# Patient Record
Sex: Female | Born: 1945 | Race: White | Hispanic: No | Marital: Married | Smoking: Former smoker
Health system: Southern US, Community
[De-identification: ages and names within clinical notes are randomized; demographics above are authoritative.]

## PROBLEM LIST (undated history)

## (undated) DIAGNOSIS — I10 Essential (primary) hypertension: Secondary | ICD-10-CM

## (undated) DIAGNOSIS — M8580 Other specified disorders of bone density and structure, unspecified site: Secondary | ICD-10-CM

## (undated) DIAGNOSIS — F419 Anxiety disorder, unspecified: Secondary | ICD-10-CM

## (undated) DIAGNOSIS — E785 Hyperlipidemia, unspecified: Secondary | ICD-10-CM

## (undated) DIAGNOSIS — E782 Mixed hyperlipidemia: Secondary | ICD-10-CM

## (undated) DIAGNOSIS — E569 Vitamin deficiency, unspecified: Secondary | ICD-10-CM

## (undated) DIAGNOSIS — G4700 Insomnia, unspecified: Secondary | ICD-10-CM

## (undated) DIAGNOSIS — Z8619 Personal history of other infectious and parasitic diseases: Secondary | ICD-10-CM

## (undated) DIAGNOSIS — N951 Menopausal and female climacteric states: Secondary | ICD-10-CM

## (undated) HISTORY — PX: KNEE SURGERY: SHX244

## (undated) HISTORY — DX: Other specified disorders of bone density and structure, unspecified site: M85.80

## (undated) HISTORY — DX: Mixed hyperlipidemia: E78.2

## (undated) HISTORY — DX: Essential (primary) hypertension: I10

## (undated) HISTORY — DX: Vitamin deficiency, unspecified: E56.9

## (undated) HISTORY — DX: Anxiety disorder, unspecified: F41.9

## (undated) HISTORY — PX: ABDOMINAL EXPLORATION SURGERY: SHX538

## (undated) HISTORY — DX: Menopausal and female climacteric states: N95.1

## (undated) HISTORY — PX: BREAST CYST EXCISION: SHX579

## (undated) HISTORY — PX: KNEE ARTHROSCOPY W/ ACL RECONSTRUCTION: SHX1858

## (undated) HISTORY — DX: Personal history of other infectious and parasitic diseases: Z86.19

## (undated) HISTORY — DX: Hyperlipidemia, unspecified: E78.5

## (undated) HISTORY — DX: Insomnia, unspecified: G47.00

---

## 1998-07-25 DIAGNOSIS — N951 Menopausal and female climacteric states: Secondary | ICD-10-CM

## 1998-07-25 HISTORY — DX: Menopausal and female climacteric states: N95.1

## 1999-07-01 ENCOUNTER — Other Ambulatory Visit: Admission: RE | Admit: 1999-07-01 | Discharge: 1999-07-01 | Payer: Self-pay | Admitting: Obstetrics and Gynecology

## 1999-08-12 ENCOUNTER — Encounter: Admission: RE | Admit: 1999-08-12 | Discharge: 1999-08-12 | Payer: Self-pay | Admitting: Family Medicine

## 1999-08-12 ENCOUNTER — Encounter: Payer: Self-pay | Admitting: Family Medicine

## 2000-08-15 ENCOUNTER — Other Ambulatory Visit: Admission: RE | Admit: 2000-08-15 | Discharge: 2000-08-15 | Payer: Self-pay | Admitting: Obstetrics and Gynecology

## 2001-09-27 ENCOUNTER — Other Ambulatory Visit: Admission: RE | Admit: 2001-09-27 | Discharge: 2001-09-27 | Payer: Self-pay | Admitting: Obstetrics and Gynecology

## 2001-10-05 ENCOUNTER — Encounter: Admission: RE | Admit: 2001-10-05 | Discharge: 2001-10-05 | Payer: Self-pay | Admitting: Obstetrics and Gynecology

## 2001-10-05 ENCOUNTER — Encounter: Payer: Self-pay | Admitting: Obstetrics and Gynecology

## 2003-01-02 ENCOUNTER — Other Ambulatory Visit: Admission: RE | Admit: 2003-01-02 | Discharge: 2003-01-02 | Payer: Self-pay | Admitting: Obstetrics and Gynecology

## 2003-04-10 ENCOUNTER — Ambulatory Visit (HOSPITAL_BASED_OUTPATIENT_CLINIC_OR_DEPARTMENT_OTHER): Admission: RE | Admit: 2003-04-10 | Discharge: 2003-04-10 | Payer: Self-pay | Admitting: Orthopedic Surgery

## 2003-10-15 ENCOUNTER — Encounter: Admission: RE | Admit: 2003-10-15 | Discharge: 2003-10-15 | Payer: Self-pay | Admitting: Obstetrics and Gynecology

## 2004-03-30 ENCOUNTER — Other Ambulatory Visit: Admission: RE | Admit: 2004-03-30 | Discharge: 2004-03-30 | Payer: Self-pay | Admitting: Obstetrics and Gynecology

## 2004-09-08 ENCOUNTER — Ambulatory Visit (HOSPITAL_COMMUNITY): Admission: RE | Admit: 2004-09-08 | Discharge: 2004-09-08 | Payer: Self-pay | Admitting: Gastroenterology

## 2005-09-19 ENCOUNTER — Other Ambulatory Visit: Admission: RE | Admit: 2005-09-19 | Discharge: 2005-09-19 | Payer: Self-pay | Admitting: Obstetrics & Gynecology

## 2005-11-07 ENCOUNTER — Encounter: Admission: RE | Admit: 2005-11-07 | Discharge: 2005-11-07 | Payer: Self-pay | Admitting: Obstetrics and Gynecology

## 2006-01-31 ENCOUNTER — Encounter: Admission: RE | Admit: 2006-01-31 | Discharge: 2006-01-31 | Payer: Self-pay | Admitting: Family Medicine

## 2006-02-17 ENCOUNTER — Encounter: Admission: RE | Admit: 2006-02-17 | Discharge: 2006-02-17 | Payer: Self-pay | Admitting: Family Medicine

## 2006-10-05 ENCOUNTER — Other Ambulatory Visit: Admission: RE | Admit: 2006-10-05 | Discharge: 2006-10-05 | Payer: Self-pay | Admitting: Obstetrics & Gynecology

## 2006-11-09 ENCOUNTER — Encounter: Admission: RE | Admit: 2006-11-09 | Discharge: 2006-11-09 | Payer: Self-pay | Admitting: Obstetrics and Gynecology

## 2007-03-08 ENCOUNTER — Encounter: Admission: RE | Admit: 2007-03-08 | Discharge: 2007-03-08 | Payer: Self-pay | Admitting: Family Medicine

## 2007-10-11 ENCOUNTER — Other Ambulatory Visit: Admission: RE | Admit: 2007-10-11 | Discharge: 2007-10-11 | Payer: Self-pay | Admitting: Obstetrics & Gynecology

## 2007-11-13 ENCOUNTER — Encounter: Admission: RE | Admit: 2007-11-13 | Discharge: 2007-11-13 | Payer: Self-pay | Admitting: Obstetrics and Gynecology

## 2008-01-17 ENCOUNTER — Encounter: Admission: RE | Admit: 2008-01-17 | Discharge: 2008-01-17 | Payer: Self-pay | Admitting: Family Medicine

## 2008-10-13 ENCOUNTER — Other Ambulatory Visit: Admission: RE | Admit: 2008-10-13 | Discharge: 2008-10-13 | Payer: Self-pay | Admitting: Obstetrics & Gynecology

## 2008-11-18 ENCOUNTER — Encounter: Admission: RE | Admit: 2008-11-18 | Discharge: 2008-11-18 | Payer: Self-pay | Admitting: Obstetrics and Gynecology

## 2009-11-19 ENCOUNTER — Encounter: Admission: RE | Admit: 2009-11-19 | Discharge: 2009-11-19 | Payer: Self-pay | Admitting: Obstetrics and Gynecology

## 2010-08-15 ENCOUNTER — Encounter: Payer: Self-pay | Admitting: Family Medicine

## 2010-10-22 ENCOUNTER — Other Ambulatory Visit: Payer: Self-pay | Admitting: Obstetrics and Gynecology

## 2010-10-22 DIAGNOSIS — N951 Menopausal and female climacteric states: Secondary | ICD-10-CM

## 2010-10-22 DIAGNOSIS — Z1231 Encounter for screening mammogram for malignant neoplasm of breast: Secondary | ICD-10-CM

## 2010-11-25 ENCOUNTER — Ambulatory Visit
Admission: RE | Admit: 2010-11-25 | Discharge: 2010-11-25 | Disposition: A | Discharge status: Home / Self Care | Payer: 59 | Source: Ambulatory Visit | Attending: Obstetrics and Gynecology | Admitting: Obstetrics and Gynecology

## 2010-11-25 DIAGNOSIS — Z1231 Encounter for screening mammogram for malignant neoplasm of breast: Secondary | ICD-10-CM

## 2010-11-25 DIAGNOSIS — N951 Menopausal and female climacteric states: Secondary | ICD-10-CM

## 2010-12-10 NOTE — Op Note (Signed)
Evelyn Heath, Evelyn Heath               ACCOUNT NO.:  [id number]   MEDICAL RECORD NO.:  [id number]          PATIENT TYPE:  AMB   LOCATION:  ENDO                         FACILITY:  [facility]   PHYSICIAN:  Anselmo Rod, M.D.  DATE OF BIRTH:  Oct 03, 1945   DATE OF PROCEDURE:  09/08/2004  DATE OF DISCHARGE:                                 OPERATIVE REPORT   PROCEDURE PERFORMED:  Screening colonoscopy.   ENDOSCOPIST:  Charna Elizabeth, M.D.   INSTRUMENT USED:  Olympus video colonoscope.   INDICATIONS FOR PROCEDURE:  The patient is a 65 year old white female  undergoing screening colonoscopy to rule out colonic polyps, masses, etc.   PREPROCEDURE PREPARATION:  Informed consent was procured from the patient.  The patient was fasted for eight hours prior to the procedure and prepped  with a bottle of magnesium citrate and a gallon of GoLYTELY the night prior  to the procedure.  The risks and benefits of the procedure including a 10%  miss rate for colon polyps or cancers was discussed with the patient as  well.   PREPROCEDURE PHYSICAL:  The patient had stable vital signs.  Neck supple.  Chest clear to auscultation.  S1 and S2 regular.  Abdomen soft with normal  bowel sounds.   DESCRIPTION OF PROCEDURE:  The patient was placed in left lateral decubitus  position and sedated with 100 mg of Demerol and 10 mg of Versed in slow  incremental doses.  Once the patient was adequately sedated and maintained  on low flow oxygen and continuous cardiac monitoring, the Olympus video  colonoscope was advanced from the rectum to the cecum.  The appendicular  orifice and ileocecal valve were clearly visualized and photographed.  The  terminal ileum appeared healthy and without lesions.  No masses, polyps,  erosions, ulcerations or diverticula were seen. The patient tolerated the  procedure well without immediate complication.  The prep was excellent and  visualization was good.   IMPRESSION:  Normal colonoscopy  up to the terminal ileum, no masses or  polyps seen.  No evidence of diverticulosis.   RECOMMENDATIONS:  1.  Continue high fiber diet with liberal fluid intake.  2.  Repeat colonoscopy is recommended in the next five years unless the      patient develops any abnormal symptoms in the interim.  3.  Outpatient followup as need arises in the future.      JNM/MEDQ  D:  09/08/2004  T:  09/08/2004  Job:  [id number]   cc:   Laqueta Linden, M.D.  [address], [address]  [place]  [place] [address]  Fax: [phone]   [facility]

## 2010-12-10 NOTE — Op Note (Signed)
NAMESHATORA, WEATHERBEE                         ACCOUNT NO.:  [id number]   MEDICAL RECORD NO.:  [id number]                   PATIENT TYPE:  AMB   LOCATION:  DSC                                  FACILITY:  [facility]   PHYSICIAN:  Loreta Ave, M.D.              DATE OF BIRTH:  12-Apr-1946   DATE OF PROCEDURE:  04/10/2003  DATE OF DISCHARGE:                                 OPERATIVE REPORT   PREOPERATIVE DIAGNOSES:  1. Degenerative complex tearing, medial and lateral meniscus, left knee.  2. Status post anterior cruciate ligament reconstruction with intact graft.   POSTOPERATIVE DIAGNOSIS:  1. Degenerative complex tearing, medial and lateral meniscus, left knee,     with grade 2 chondral changes, all three compartments, and adhesions.  2. Status post anterior cruciate ligament reconstruction with intact graft.   PROCEDURES:  1. Left knee examination under anesthesia, arthroscopy, with partial medial     and lateral meniscectomy.  2. Lysis and debridement of adhesions.  3. Superficial chondroplasty,all three compartments.   SURGEON:  Loreta Ave, M.D.   ASSISTANT:  Arlys John D. Petrarca, P.A.-C.   ANESTHESIA:  General.   ESTIMATED BLOOD LOSS:  Minimal.   SPECIMENS:  None.   CULTURES:  None.   COMPLICATIONS:  None.   DRESSING:  Soft compressive.   DESCRIPTION OF PROCEDURE:  The patient was brought to the operating room and  placed on the operating table in supine position.  After adequate anesthesia  had been obtained, left knee examined.  Some decreased patellofemoral  mobility but otherwise full motion.  Excellent stability with negative  Lachman and drawer.  Tourniquet and leg holder applied, leg prepped and  draped in the usual sterile fashion.  Three portals were created, one  superolateral, one each medial and lateral peripatellar.  Inflow catheter  introduced, knee distended, arthroscope introduced, knee inspected.  A fair  amount of adhesions anteriorly and in  both gutters.  All of these debrided  extensively with a shaver, hemostasis with cautery.  The patellofemoral  joint had some mild grade 2 changes but good tracking.  Cruciate ligament  intact and very functional.  Previous medial and lateral meniscectomy with  recurrent complex tearing, posterior half of both of these.  Saucerized out  to a stable rim, tapered into remaining meniscus.  Some grade 2 changes on  the lateral tibial plateau and medial femoral condyle, treated with  superficial  chondroplasty.  Entire knee examined and no other findings appreciated.  The  instruments and fluid removed.  Portals and knee injected with Marcaine.  Portals closed with 4-0 nylon.  A sterile compressive dressing applied.  Anesthesia reversed.  Brought to the recovery room.  Tolerated the surgery  well with no complications.  Loreta Ave, M.D.    DFM/MEDQ  D:  04/10/2003  T:  04/11/2003  Job:  [id number]

## 2011-10-21 ENCOUNTER — Other Ambulatory Visit: Payer: Self-pay | Admitting: Obstetrics and Gynecology

## 2011-10-21 DIAGNOSIS — Z1231 Encounter for screening mammogram for malignant neoplasm of breast: Secondary | ICD-10-CM

## 2011-10-27 LAB — HM PAP SMEAR: HM Pap smear: NORMAL

## 2011-11-28 ENCOUNTER — Ambulatory Visit
Admission: RE | Admit: 2011-11-28 | Discharge: 2011-11-28 | Disposition: A | Discharge status: Home / Self Care | Payer: 59 | Source: Ambulatory Visit | Attending: Obstetrics and Gynecology | Admitting: Obstetrics and Gynecology

## 2011-11-28 DIAGNOSIS — Z1231 Encounter for screening mammogram for malignant neoplasm of breast: Secondary | ICD-10-CM

## 2012-03-20 ENCOUNTER — Encounter (INDEPENDENT_AMBULATORY_CARE_PROVIDER_SITE_OTHER): Payer: Self-pay | Admitting: Surgery

## 2012-03-30 ENCOUNTER — Ambulatory Visit (INDEPENDENT_AMBULATORY_CARE_PROVIDER_SITE_OTHER): Payer: 59 | Admitting: Surgery

## 2012-03-30 ENCOUNTER — Encounter (INDEPENDENT_AMBULATORY_CARE_PROVIDER_SITE_OTHER): Payer: Self-pay | Admitting: Surgery

## 2012-03-30 VITALS — BP 138/64 | HR 77 | Temp 97.0°F | Ht 68.5 in | Wt 184.0 lb

## 2012-03-30 DIAGNOSIS — N6089 Other benign mammary dysplasias of unspecified breast: Secondary | ICD-10-CM

## 2012-03-30 NOTE — Progress Notes (Signed)
Patient ID: Evelyn Heath, female   DOB: May 14, 1946, 66 y.o.   MRN: [id number]  Chief Complaint  Patient presents with  . Pre-op Exam    eval seb cyst Rt br    HPI Evelyn Heath is a 66 y.o. female.  Referred by Dr. Juluis Rainier for a sebaceous cyst of the right breast HPI This is a 66 year old female who presents with many years of a small "blackhead" just medial to her right breast. She used to squeeze this and would express some whitish material.  However, this summer it became infected. It became quite swollen and draining some bloody purulent material. It improved with a course of antibiotics. Currently it is asymptomatic. She presents now for surgical evaluation for excision.  Past Medical History  Diagnosis Date  . Hypertension   . Anxiety   . Hyperlipidemia   . Osteoporosis   . Hiatal hernia   . H/O blood clots   . Vitamin deficiency   . Diabetes mellitus     Past Surgical History  Procedure Date  . Knee surgery     left  . Knee arthroscopy w/ acl reconstruction     left    Family History  Problem Relation Age of Onset  . Cancer Mother     bone  . Cancer Father     ccl  . Cancer Maternal Grandmother     breast    Social History History  Substance Use Topics  . Smoking status: Current Everyday Smoker -- 0.5 packs/day    Types: Cigarettes  . Smokeless tobacco: Not on file  . Alcohol Use: No    Allergies  Allergen Reactions  . Bactrim (Sulfamethoxazole W-Trimethoprim) Nausea Only  . Lipitor (Atorvastatin)     Numbness in fingers  . Codeine Rash  . Crestor (Rosuvastatin)   . Tetracyclines & Related Rash    Rash in mouth    Current Outpatient Prescriptions  Medication Sig Dispense Refill  . ALPRAZolam (XANAX) 0.25 MG tablet Take 0.25 mg by mouth at bedtime as needed.      . cholecalciferol (VITAMIN D) 1000 UNITS tablet Take 2,000 Units by mouth daily.      . colesevelam (WELCHOL) 625 MG tablet Take 1,875 mg by mouth 2 (two) times daily with  a meal.      . hydrochlorothiazide (HYDRODIURIL) 25 MG tablet Take 25 mg by mouth daily.        Review of Systems Review of Systems  Blood pressure 138/64, pulse 77, temperature 97 F (36.1 C), temperature source Temporal, height 5' 8.5" (1.74 m), weight 184 lb (83.462 kg), SpO2 98.00%.  Physical Exam Physical Exam Chest wall - medial to right breast - 1 cm subcutaneous cyst with no erythema or drainage; non-tender. Data Reviewed none  Assessment    Sebaceous cyst - right chest wall    Plan    Excision under local anesthesia in the office.  Will schedule in the next two weeks for an office procedure. The surgical procedure has been discussed with the patient.  Potential risks, benefits, alternative treatments, and expected outcomes have been explained.  All of the patient's questions at this time have been answered.  The likelihood of reaching the patient's treatment goal is good.  The patient understand the proposed surgical procedure and wishes to proceed.        Jiovanny Burdell K. 03/30/2012, 10:35 AM

## 2012-04-11 ENCOUNTER — Ambulatory Visit (INDEPENDENT_AMBULATORY_CARE_PROVIDER_SITE_OTHER): Payer: 59 | Admitting: Surgery

## 2012-04-11 ENCOUNTER — Encounter (INDEPENDENT_AMBULATORY_CARE_PROVIDER_SITE_OTHER): Payer: Self-pay | Admitting: Surgery

## 2012-04-11 VITALS — BP 124/70 | HR 86 | Temp 98.3°F | Resp 18 | Ht 68.0 in | Wt 185.6 lb

## 2012-04-11 DIAGNOSIS — N6089 Other benign mammary dysplasias of unspecified breast: Secondary | ICD-10-CM

## 2012-04-11 DIAGNOSIS — L723 Sebaceous cyst: Secondary | ICD-10-CM

## 2012-04-11 NOTE — Progress Notes (Signed)
The patient presents for excision of the sebaceous cyst of her chest. The cyst has actually gotten smaller and does not appear to be inflamed. We prepped her with Betadine and anesthetized with 1% lidocaine. I made an elliptical incision around the previous scar including the opening of the cyst. We excised the entire cyst down to normal-appearing tissue. Hemostasis was maintained with pressure. I closed the wound with a subcuticular layer of 4-0 Monocryl. Steri-Strips and clean dressings were applied. The patient tolerated the procedure well. She may remove the dressing and shower in 2 days. Followup when necessary.  Wilmon Arms. Corliss Skains, MD, [facility]  04/11/2012 4:32 PM

## 2012-08-21 DIAGNOSIS — M5137 Other intervertebral disc degeneration, lumbosacral region: Secondary | ICD-10-CM | POA: Diagnosis not present

## 2012-08-21 DIAGNOSIS — IMO0002 Reserved for concepts with insufficient information to code with codable children: Secondary | ICD-10-CM | POA: Diagnosis not present

## 2012-08-21 DIAGNOSIS — M5430 Sciatica, unspecified side: Secondary | ICD-10-CM | POA: Diagnosis not present

## 2012-08-21 DIAGNOSIS — M1710 Unilateral primary osteoarthritis, unspecified knee: Secondary | ICD-10-CM | POA: Diagnosis not present

## 2012-09-03 ENCOUNTER — Other Ambulatory Visit: Payer: Self-pay | Admitting: Sports Medicine

## 2012-09-03 DIAGNOSIS — M545 Low back pain: Secondary | ICD-10-CM

## 2012-09-12 ENCOUNTER — Ambulatory Visit
Admission: RE | Admit: 2012-09-12 | Discharge: 2012-09-12 | Disposition: A | Discharge status: Home / Self Care | Payer: Medicare Other | Source: Ambulatory Visit | Attending: Sports Medicine | Admitting: Sports Medicine

## 2012-09-12 DIAGNOSIS — M545 Low back pain: Secondary | ICD-10-CM

## 2012-09-20 DIAGNOSIS — R209 Unspecified disturbances of skin sensation: Secondary | ICD-10-CM | POA: Diagnosis not present

## 2012-09-20 DIAGNOSIS — M5137 Other intervertebral disc degeneration, lumbosacral region: Secondary | ICD-10-CM | POA: Diagnosis not present

## 2012-09-26 ENCOUNTER — Encounter: Payer: Self-pay | Admitting: Nurse Practitioner

## 2012-09-26 ENCOUNTER — Ambulatory Visit: Payer: Medicare Other | Attending: Physical Medicine and Rehabilitation | Admitting: Physical Therapy

## 2012-09-26 DIAGNOSIS — R262 Difficulty in walking, not elsewhere classified: Secondary | ICD-10-CM | POA: Insufficient documentation

## 2012-09-26 DIAGNOSIS — IMO0001 Reserved for inherently not codable concepts without codable children: Secondary | ICD-10-CM | POA: Insufficient documentation

## 2012-10-02 ENCOUNTER — Ambulatory Visit: Payer: Medicare Other | Admitting: Physical Therapy

## 2012-10-02 DIAGNOSIS — R262 Difficulty in walking, not elsewhere classified: Secondary | ICD-10-CM | POA: Diagnosis not present

## 2012-10-11 ENCOUNTER — Ambulatory Visit: Payer: Medicare Other | Admitting: Physical Therapy

## 2012-10-11 DIAGNOSIS — IMO0001 Reserved for inherently not codable concepts without codable children: Secondary | ICD-10-CM | POA: Diagnosis not present

## 2012-10-18 ENCOUNTER — Ambulatory Visit: Payer: Medicare Other | Admitting: Physical Therapy

## 2012-10-25 ENCOUNTER — Ambulatory Visit: Payer: Medicare Other | Attending: Physical Medicine and Rehabilitation | Admitting: Physical Therapy

## 2012-10-25 DIAGNOSIS — R262 Difficulty in walking, not elsewhere classified: Secondary | ICD-10-CM | POA: Insufficient documentation

## 2012-10-25 DIAGNOSIS — IMO0001 Reserved for inherently not codable concepts without codable children: Secondary | ICD-10-CM | POA: Diagnosis not present

## 2012-10-29 ENCOUNTER — Encounter: Payer: Self-pay | Admitting: Nurse Practitioner

## 2012-10-29 ENCOUNTER — Ambulatory Visit (INDEPENDENT_AMBULATORY_CARE_PROVIDER_SITE_OTHER): Payer: Medicare Other | Admitting: Nurse Practitioner

## 2012-10-29 VITALS — BP 130/80 | HR 78 | Resp 16 | Ht 67.0 in | Wt 197.0 lb

## 2012-10-29 DIAGNOSIS — Z01419 Encounter for gynecological examination (general) (routine) without abnormal findings: Secondary | ICD-10-CM

## 2012-10-29 DIAGNOSIS — N951 Menopausal and female climacteric states: Secondary | ICD-10-CM

## 2012-10-29 DIAGNOSIS — M5137 Other intervertebral disc degeneration, lumbosacral region: Secondary | ICD-10-CM

## 2012-10-29 DIAGNOSIS — Z124 Encounter for screening for malignant neoplasm of cervix: Secondary | ICD-10-CM | POA: Diagnosis not present

## 2012-10-29 DIAGNOSIS — M5136 Other intervertebral disc degeneration, lumbar region: Secondary | ICD-10-CM

## 2012-10-29 DIAGNOSIS — M51379 Other intervertebral disc degeneration, lumbosacral region without mention of lumbar back pain or lower extremity pain: Secondary | ICD-10-CM

## 2012-10-29 NOTE — Progress Notes (Signed)
67 y.o. G16P0010 Married Caucasian Fe here for annual exam.  States she feels well.  The only new health issues is chronic back discomfort from degenerative disc disease and is getting PT.  She had numbness of left foot that prompted evaluation. Husband had stroke last year and she has to be very cautious of his activities.   No LMP recorded. Patient is postmenopausal.          Sexually active: yes  The current method of family planning is none.    Exercising: no  The patient does not participate in regular exercise at present. Smoker:  No - Quit first of September 2013.  Health Maintenance: Pap: 4/413 normal with Neg HR HPV MMG: 11/28/11 normal Colonoscopy: 8/12 normal recheck 10 yrs BMD:  5/12 borderline low normal TDaP:Unsure, Shingles vaccination 04/08 Labs:PCP [facility] at [facility]   reports that she has quit smoking. Her smoking use included Cigarettes. She smoked 0.50 packs per day. She has never used smokeless tobacco. She reports that  drinks alcohol. She reports that she does not use illicit drugs.  Past Medical History  Diagnosis Date  . Hypertension   . Anxiety   . Hyperlipidemia   . Osteoporosis   . Vitamin deficiency   . Elevated cholesterol with elevated triglycerides   . Borderline osteopenia   . Insomnia, persistent   . History of shingles 10/07; 1/08; 2/08    per patient  . Menopausal state 2000    no HRT    Past Surgical History  Procedure Laterality Date  . Knee surgery      left  . Knee arthroscopy w/ acl reconstruction      left  . Abdominal exploration surgery  1981/ 1982    following ectopic pregnancy    Current Outpatient Prescriptions  Medication Sig Dispense Refill  . ALPRAZolam (XANAX) 0.25 MG tablet Take 0.25 mg by mouth at bedtime as needed.      . cholecalciferol (VITAMIN D) 1000 UNITS tablet Take 2,000 Units by mouth daily.      . hydrochlorothiazide (HYDRODIURIL) 25 MG tablet Take 25 mg by mouth daily.      Marland Kitchen lovastatin  (MEVACOR) 10 MG tablet Take 10 mg by mouth at bedtime.      . Melatonin 1 MG CAPS Take by mouth at bedtime.       No current facility-administered medications for this visit.    Family History  Problem Relation Age of Onset  . Cancer Mother     Bone primary site unknown  . Cancer Father 39    CLL  . Cancer Maternal Grandmother     Breast  . Diabetes Father 3    ROS:  Pertinent items are noted in HPI.  Otherwise, a comprehensive ROS was negative.  Exam:   BP 130/80  Pulse 78  Resp 16  Ht 5\' 7"  (1.702 m)  Wt 197 lb (89.359 kg)  BMI 30.85 kg/m2  Height: 5\' 7"  (170.2 cm)  Ht Readings from Last 3 Encounters:  10/29/12 5\' 7"  (1.702 m)  04/11/12 5\' 8"  (1.727 m)  03/30/12 5' 8.5" (1.74 m)    General appearance: alert, cooperative and appears stated age Head: Normocephalic, without obvious abnormality, atraumatic Neck: no adenopathy, supple, symmetrical, trachea midline and thyroid non-palpable Lungs: clear to auscultation bilaterally Breasts: normal appearance, no masses or tenderness Heart: regular rate and rhythm Abdomen: soft, non-tender; bowel sounds normal; no masses,  no organomegaly Extremities: extremities normal, atraumatic, no cyanosis or edema Skin:  Skin color, texture, turgor normal. No rashes or lesions Lymph nodes: Cervical, supraclavicular, and axillary nodes normal. No abnormal inguinal nodes palpated Neurologic: Grossly normal   Pelvic: External genitalia:  no lesions              Urethra:  normal appearing urethra with no masses, tenderness or lesions              Bartholin's and Skene's: normal                 Vagina: normal appearing vagina with atrophic color and no discharge, no lesions              Cervix: anteverted              Pap taken: no Bimanual Exam:  Uterus:  normal size, contour, position, consistency, mobility, non-tender              Adnexa: no mass, fullness, tenderness               Rectovaginal: Confirms               Anus:  normal  sphincter tone, no lesions  A:  Well Woman with normal exam  Postmenopausal- no HRT  DDD of lumbar spine  P:   Mammogram due 11/2012  pap smear as per guidelines  return annually or prn  An After Visit Summary was printed and given to the patient.

## 2012-10-29 NOTE — Patient Instructions (Signed)

## 2012-10-30 NOTE — Progress Notes (Signed)
Encounter reviewed by Dr. Brook Silva.  

## 2012-11-07 ENCOUNTER — Other Ambulatory Visit: Payer: Self-pay

## 2012-11-07 DIAGNOSIS — Z1231 Encounter for screening mammogram for malignant neoplasm of breast: Secondary | ICD-10-CM

## 2012-11-27 DIAGNOSIS — M5137 Other intervertebral disc degeneration, lumbosacral region: Secondary | ICD-10-CM | POA: Diagnosis not present

## 2012-11-27 DIAGNOSIS — R209 Unspecified disturbances of skin sensation: Secondary | ICD-10-CM | POA: Diagnosis not present

## 2012-11-29 DIAGNOSIS — F329 Major depressive disorder, single episode, unspecified: Secondary | ICD-10-CM | POA: Diagnosis not present

## 2012-11-29 DIAGNOSIS — I10 Essential (primary) hypertension: Secondary | ICD-10-CM | POA: Diagnosis not present

## 2012-11-29 DIAGNOSIS — E559 Vitamin D deficiency, unspecified: Secondary | ICD-10-CM | POA: Diagnosis not present

## 2012-11-29 DIAGNOSIS — K219 Gastro-esophageal reflux disease without esophagitis: Secondary | ICD-10-CM | POA: Diagnosis not present

## 2012-11-29 DIAGNOSIS — R5381 Other malaise: Secondary | ICD-10-CM | POA: Diagnosis not present

## 2012-11-29 DIAGNOSIS — E785 Hyperlipidemia, unspecified: Secondary | ICD-10-CM | POA: Diagnosis not present

## 2012-12-03 ENCOUNTER — Ambulatory Visit
Admission: RE | Admit: 2012-12-03 | Discharge: 2012-12-03 | Disposition: A | Discharge status: Home / Self Care | Payer: Medicare Other | Source: Ambulatory Visit

## 2012-12-03 DIAGNOSIS — Z1231 Encounter for screening mammogram for malignant neoplasm of breast: Secondary | ICD-10-CM | POA: Diagnosis not present

## 2013-03-01 DIAGNOSIS — E785 Hyperlipidemia, unspecified: Secondary | ICD-10-CM | POA: Diagnosis not present

## 2013-03-21 DIAGNOSIS — M5126 Other intervertebral disc displacement, lumbar region: Secondary | ICD-10-CM | POA: Diagnosis not present

## 2013-03-21 DIAGNOSIS — G5730 Lesion of lateral popliteal nerve, unspecified lower limb: Secondary | ICD-10-CM | POA: Diagnosis not present

## 2013-03-29 DIAGNOSIS — R209 Unspecified disturbances of skin sensation: Secondary | ICD-10-CM | POA: Diagnosis not present

## 2013-03-29 DIAGNOSIS — M5126 Other intervertebral disc displacement, lumbar region: Secondary | ICD-10-CM | POA: Diagnosis not present

## 2013-04-04 ENCOUNTER — Other Ambulatory Visit: Payer: Self-pay | Admitting: Neurosurgery

## 2013-04-04 DIAGNOSIS — M5126 Other intervertebral disc displacement, lumbar region: Secondary | ICD-10-CM | POA: Diagnosis not present

## 2013-04-04 DIAGNOSIS — M545 Low back pain: Secondary | ICD-10-CM

## 2013-04-10 ENCOUNTER — Ambulatory Visit
Admission: RE | Admit: 2013-04-10 | Discharge: 2013-04-10 | Disposition: A | Discharge status: Home / Self Care | Payer: Medicare Other | Source: Ambulatory Visit | Attending: Neurosurgery | Admitting: Neurosurgery

## 2013-04-10 VITALS — BP 143/64 | HR 66

## 2013-04-10 DIAGNOSIS — M545 Low back pain: Secondary | ICD-10-CM

## 2013-04-10 DIAGNOSIS — M47817 Spondylosis without myelopathy or radiculopathy, lumbosacral region: Secondary | ICD-10-CM | POA: Diagnosis not present

## 2013-04-10 MED ORDER — METHYLPREDNISOLONE ACETATE 40 MG/ML INJ SUSP (RADIOLOG
120.0000 mg | Freq: Once | INTRAMUSCULAR | Status: AC
  Administered 2013-04-10: 120 mg via EPIDURAL

## 2013-04-10 MED ORDER — IOHEXOL 180 MG/ML  SOLN
1.0000 mL | Freq: Once | INTRAMUSCULAR | Status: AC | PRN
  Administered 2013-04-10: 1 mL via EPIDURAL

## 2013-05-09 DIAGNOSIS — M5126 Other intervertebral disc displacement, lumbar region: Secondary | ICD-10-CM | POA: Diagnosis not present

## 2013-06-06 ENCOUNTER — Other Ambulatory Visit: Payer: Self-pay | Admitting: Neurosurgery

## 2013-06-06 DIAGNOSIS — M545 Low back pain, unspecified: Secondary | ICD-10-CM

## 2013-06-12 ENCOUNTER — Ambulatory Visit
Admission: RE | Admit: 2013-06-12 | Discharge: 2013-06-12 | Disposition: A | Discharge status: Home / Self Care | Payer: Medicare Other | Source: Ambulatory Visit | Attending: Neurosurgery | Admitting: Neurosurgery

## 2013-06-12 VITALS — BP 127/76 | HR 72

## 2013-06-12 DIAGNOSIS — M47817 Spondylosis without myelopathy or radiculopathy, lumbosacral region: Secondary | ICD-10-CM | POA: Diagnosis not present

## 2013-06-12 DIAGNOSIS — M48061 Spinal stenosis, lumbar region without neurogenic claudication: Secondary | ICD-10-CM | POA: Diagnosis not present

## 2013-06-12 DIAGNOSIS — M545 Low back pain: Secondary | ICD-10-CM

## 2013-06-12 MED ORDER — METHYLPREDNISOLONE ACETATE 40 MG/ML INJ SUSP (RADIOLOG
120.0000 mg | Freq: Once | INTRAMUSCULAR | Status: AC
  Administered 2013-06-12: 120 mg via EPIDURAL

## 2013-06-12 MED ORDER — IOHEXOL 180 MG/ML  SOLN
1.0000 mL | Freq: Once | INTRAMUSCULAR | Status: AC | PRN
  Administered 2013-06-12: 1 mL via EPIDURAL

## 2013-08-08 DIAGNOSIS — Z01818 Encounter for other preprocedural examination: Secondary | ICD-10-CM | POA: Diagnosis not present

## 2013-08-08 DIAGNOSIS — M5126 Other intervertebral disc displacement, lumbar region: Secondary | ICD-10-CM | POA: Diagnosis not present

## 2013-08-16 DIAGNOSIS — IMO0002 Reserved for concepts with insufficient information to code with codable children: Secondary | ICD-10-CM | POA: Diagnosis not present

## 2013-08-16 DIAGNOSIS — M48061 Spinal stenosis, lumbar region without neurogenic claudication: Secondary | ICD-10-CM | POA: Diagnosis not present

## 2013-08-16 DIAGNOSIS — M5380 Other specified dorsopathies, site unspecified: Secondary | ICD-10-CM | POA: Diagnosis not present

## 2013-08-16 DIAGNOSIS — M5126 Other intervertebral disc displacement, lumbar region: Secondary | ICD-10-CM | POA: Diagnosis not present

## 2013-10-01 ENCOUNTER — Ambulatory Visit: Payer: Medicare Other | Attending: Neurosurgery | Admitting: Physical Therapy

## 2013-10-01 DIAGNOSIS — IMO0001 Reserved for inherently not codable concepts without codable children: Secondary | ICD-10-CM | POA: Diagnosis not present

## 2013-10-01 DIAGNOSIS — M545 Low back pain, unspecified: Secondary | ICD-10-CM | POA: Diagnosis not present

## 2013-10-04 ENCOUNTER — Ambulatory Visit: Payer: Medicare Other | Admitting: Physical Therapy

## 2013-10-10 ENCOUNTER — Ambulatory Visit: Payer: Medicare Other | Admitting: Physical Therapy

## 2013-10-15 ENCOUNTER — Ambulatory Visit: Payer: Medicare Other | Admitting: Physical Therapy

## 2013-10-17 ENCOUNTER — Ambulatory Visit: Payer: Medicare Other | Admitting: Physical Therapy

## 2013-10-21 ENCOUNTER — Ambulatory Visit: Payer: Medicare Other | Admitting: Physical Therapy

## 2013-10-24 ENCOUNTER — Ambulatory Visit: Payer: Medicare Other | Attending: Neurosurgery | Admitting: Physical Therapy

## 2013-10-24 DIAGNOSIS — E7800 Pure hypercholesterolemia, unspecified: Secondary | ICD-10-CM | POA: Diagnosis not present

## 2013-10-24 DIAGNOSIS — N183 Chronic kidney disease, stage 3 unspecified: Secondary | ICD-10-CM | POA: Diagnosis not present

## 2013-10-24 DIAGNOSIS — M545 Low back pain, unspecified: Secondary | ICD-10-CM | POA: Insufficient documentation

## 2013-10-24 DIAGNOSIS — IMO0001 Reserved for inherently not codable concepts without codable children: Secondary | ICD-10-CM | POA: Insufficient documentation

## 2013-10-24 DIAGNOSIS — Z23 Encounter for immunization: Secondary | ICD-10-CM | POA: Diagnosis not present

## 2013-10-24 DIAGNOSIS — F329 Major depressive disorder, single episode, unspecified: Secondary | ICD-10-CM | POA: Diagnosis not present

## 2013-10-24 DIAGNOSIS — I10 Essential (primary) hypertension: Secondary | ICD-10-CM | POA: Diagnosis not present

## 2013-10-24 DIAGNOSIS — Z1331 Encounter for screening for depression: Secondary | ICD-10-CM | POA: Diagnosis not present

## 2013-10-24 DIAGNOSIS — E559 Vitamin D deficiency, unspecified: Secondary | ICD-10-CM | POA: Diagnosis not present

## 2013-10-29 ENCOUNTER — Other Ambulatory Visit: Payer: Self-pay

## 2013-10-29 DIAGNOSIS — Z1231 Encounter for screening mammogram for malignant neoplasm of breast: Secondary | ICD-10-CM

## 2013-10-30 ENCOUNTER — Ambulatory Visit: Payer: Medicare Other | Admitting: Physical Therapy

## 2013-11-04 ENCOUNTER — Ambulatory Visit: Payer: Medicare Other | Admitting: Physical Therapy

## 2013-11-08 ENCOUNTER — Ambulatory Visit: Payer: Medicare Other | Admitting: Physical Therapy

## 2013-11-11 ENCOUNTER — Ambulatory Visit: Payer: Medicare Other | Admitting: Physical Therapy

## 2013-11-13 ENCOUNTER — Ambulatory Visit: Payer: Medicare Other | Admitting: Physical Therapy

## 2013-11-19 ENCOUNTER — Ambulatory Visit: Payer: Medicare Other | Admitting: Physical Therapy

## 2013-11-21 ENCOUNTER — Ambulatory Visit: Payer: Medicare Other | Admitting: Physical Therapy

## 2013-11-26 ENCOUNTER — Ambulatory Visit: Payer: Medicare Other | Attending: Neurosurgery | Admitting: Physical Therapy

## 2013-11-26 DIAGNOSIS — M545 Low back pain, unspecified: Secondary | ICD-10-CM | POA: Diagnosis not present

## 2013-11-26 DIAGNOSIS — IMO0001 Reserved for inherently not codable concepts without codable children: Secondary | ICD-10-CM | POA: Diagnosis not present

## 2013-11-29 ENCOUNTER — Ambulatory Visit: Payer: Medicare Other | Admitting: Physical Therapy

## 2013-11-29 DIAGNOSIS — IMO0001 Reserved for inherently not codable concepts without codable children: Secondary | ICD-10-CM | POA: Diagnosis not present

## 2013-11-29 DIAGNOSIS — M545 Low back pain, unspecified: Secondary | ICD-10-CM | POA: Diagnosis not present

## 2013-12-05 ENCOUNTER — Ambulatory Visit
Admission: RE | Admit: 2013-12-05 | Discharge: 2013-12-05 | Disposition: A | Discharge status: Home / Self Care | Payer: Medicare Other | Source: Ambulatory Visit

## 2013-12-05 DIAGNOSIS — Z1231 Encounter for screening mammogram for malignant neoplasm of breast: Secondary | ICD-10-CM

## 2014-05-09 DIAGNOSIS — E559 Vitamin D deficiency, unspecified: Secondary | ICD-10-CM | POA: Diagnosis not present

## 2014-05-09 DIAGNOSIS — E780 Pure hypercholesterolemia: Secondary | ICD-10-CM | POA: Diagnosis not present

## 2014-05-09 DIAGNOSIS — N183 Chronic kidney disease, stage 3 (moderate): Secondary | ICD-10-CM | POA: Diagnosis not present

## 2014-05-09 DIAGNOSIS — I10 Essential (primary) hypertension: Secondary | ICD-10-CM | POA: Diagnosis not present

## 2014-05-12 DIAGNOSIS — E780 Pure hypercholesterolemia: Secondary | ICD-10-CM | POA: Diagnosis not present

## 2014-05-12 DIAGNOSIS — N183 Chronic kidney disease, stage 3 (moderate): Secondary | ICD-10-CM | POA: Diagnosis not present

## 2014-05-12 DIAGNOSIS — E559 Vitamin D deficiency, unspecified: Secondary | ICD-10-CM | POA: Diagnosis not present

## 2014-05-12 DIAGNOSIS — I10 Essential (primary) hypertension: Secondary | ICD-10-CM | POA: Diagnosis not present

## 2014-05-13 DIAGNOSIS — E559 Vitamin D deficiency, unspecified: Secondary | ICD-10-CM | POA: Diagnosis not present

## 2014-05-13 DIAGNOSIS — I10 Essential (primary) hypertension: Secondary | ICD-10-CM | POA: Diagnosis not present

## 2014-05-13 DIAGNOSIS — Z79899 Other long term (current) drug therapy: Secondary | ICD-10-CM | POA: Diagnosis not present

## 2014-05-13 DIAGNOSIS — F419 Anxiety disorder, unspecified: Secondary | ICD-10-CM | POA: Diagnosis not present

## 2014-05-13 DIAGNOSIS — F329 Major depressive disorder, single episode, unspecified: Secondary | ICD-10-CM | POA: Diagnosis not present

## 2014-05-13 DIAGNOSIS — E780 Pure hypercholesterolemia: Secondary | ICD-10-CM | POA: Diagnosis not present

## 2014-05-13 DIAGNOSIS — N183 Chronic kidney disease, stage 3 (moderate): Secondary | ICD-10-CM | POA: Diagnosis not present

## 2014-05-26 ENCOUNTER — Encounter: Payer: Self-pay | Admitting: Nurse Practitioner

## 2014-07-22 DIAGNOSIS — E559 Vitamin D deficiency, unspecified: Secondary | ICD-10-CM | POA: Diagnosis not present

## 2014-07-22 DIAGNOSIS — F329 Major depressive disorder, single episode, unspecified: Secondary | ICD-10-CM | POA: Diagnosis not present

## 2014-07-22 DIAGNOSIS — F419 Anxiety disorder, unspecified: Secondary | ICD-10-CM | POA: Diagnosis not present

## 2014-07-22 DIAGNOSIS — I10 Essential (primary) hypertension: Secondary | ICD-10-CM | POA: Diagnosis not present

## 2014-07-22 DIAGNOSIS — E780 Pure hypercholesterolemia: Secondary | ICD-10-CM | POA: Diagnosis not present

## 2014-07-22 DIAGNOSIS — N183 Chronic kidney disease, stage 3 (moderate): Secondary | ICD-10-CM | POA: Diagnosis not present

## 2014-07-22 DIAGNOSIS — Z79899 Other long term (current) drug therapy: Secondary | ICD-10-CM | POA: Diagnosis not present

## 2014-11-12 DIAGNOSIS — I10 Essential (primary) hypertension: Secondary | ICD-10-CM | POA: Diagnosis not present

## 2014-11-12 DIAGNOSIS — Z1389 Encounter for screening for other disorder: Secondary | ICD-10-CM | POA: Diagnosis not present

## 2014-11-12 DIAGNOSIS — F329 Major depressive disorder, single episode, unspecified: Secondary | ICD-10-CM | POA: Diagnosis not present

## 2014-11-12 DIAGNOSIS — E559 Vitamin D deficiency, unspecified: Secondary | ICD-10-CM | POA: Diagnosis not present

## 2014-11-12 DIAGNOSIS — N183 Chronic kidney disease, stage 3 (moderate): Secondary | ICD-10-CM | POA: Diagnosis not present

## 2014-11-12 DIAGNOSIS — F419 Anxiety disorder, unspecified: Secondary | ICD-10-CM | POA: Diagnosis not present

## 2014-11-12 DIAGNOSIS — E780 Pure hypercholesterolemia: Secondary | ICD-10-CM | POA: Diagnosis not present

## 2014-11-12 DIAGNOSIS — Z79899 Other long term (current) drug therapy: Secondary | ICD-10-CM | POA: Diagnosis not present

## 2014-11-12 DIAGNOSIS — Z23 Encounter for immunization: Secondary | ICD-10-CM | POA: Diagnosis not present

## 2014-11-12 DIAGNOSIS — R7301 Impaired fasting glucose: Secondary | ICD-10-CM | POA: Diagnosis not present

## 2014-11-27 ENCOUNTER — Other Ambulatory Visit: Payer: Self-pay

## 2014-11-27 DIAGNOSIS — Z1231 Encounter for screening mammogram for malignant neoplasm of breast: Secondary | ICD-10-CM

## 2014-12-18 ENCOUNTER — Ambulatory Visit: Payer: Medicare Other

## 2014-12-25 ENCOUNTER — Ambulatory Visit
Admission: RE | Admit: 2014-12-25 | Discharge: 2014-12-25 | Disposition: A | Discharge status: Home / Self Care | Payer: Medicare Other | Source: Ambulatory Visit

## 2014-12-25 DIAGNOSIS — Z1231 Encounter for screening mammogram for malignant neoplasm of breast: Secondary | ICD-10-CM

## 2015-02-05 DIAGNOSIS — L218 Other seborrheic dermatitis: Secondary | ICD-10-CM | POA: Diagnosis not present

## 2015-02-05 DIAGNOSIS — L723 Sebaceous cyst: Secondary | ICD-10-CM | POA: Diagnosis not present

## 2015-05-14 DIAGNOSIS — R0989 Other specified symptoms and signs involving the circulatory and respiratory systems: Secondary | ICD-10-CM | POA: Diagnosis not present

## 2015-05-14 DIAGNOSIS — I10 Essential (primary) hypertension: Secondary | ICD-10-CM | POA: Diagnosis not present

## 2015-05-14 DIAGNOSIS — R7301 Impaired fasting glucose: Secondary | ICD-10-CM | POA: Diagnosis not present

## 2015-05-14 DIAGNOSIS — Z23 Encounter for immunization: Secondary | ICD-10-CM | POA: Diagnosis not present

## 2015-05-14 DIAGNOSIS — R5381 Other malaise: Secondary | ICD-10-CM | POA: Diagnosis not present

## 2015-05-14 DIAGNOSIS — G479 Sleep disorder, unspecified: Secondary | ICD-10-CM | POA: Diagnosis not present

## 2015-05-14 DIAGNOSIS — E7800 Pure hypercholesterolemia, unspecified: Secondary | ICD-10-CM | POA: Diagnosis not present

## 2015-05-14 DIAGNOSIS — F419 Anxiety disorder, unspecified: Secondary | ICD-10-CM | POA: Diagnosis not present

## 2015-05-14 DIAGNOSIS — E559 Vitamin D deficiency, unspecified: Secondary | ICD-10-CM | POA: Diagnosis not present

## 2015-05-14 DIAGNOSIS — F339 Major depressive disorder, recurrent, unspecified: Secondary | ICD-10-CM | POA: Diagnosis not present

## 2015-05-14 DIAGNOSIS — N183 Chronic kidney disease, stage 3 (moderate): Secondary | ICD-10-CM | POA: Diagnosis not present

## 2015-05-21 DIAGNOSIS — I708 Atherosclerosis of other arteries: Secondary | ICD-10-CM | POA: Diagnosis not present

## 2015-05-29 DIAGNOSIS — E041 Nontoxic single thyroid nodule: Secondary | ICD-10-CM | POA: Diagnosis not present

## 2015-05-30 ENCOUNTER — Other Ambulatory Visit: Payer: Self-pay | Admitting: Family Medicine

## 2015-05-30 DIAGNOSIS — E041 Nontoxic single thyroid nodule: Secondary | ICD-10-CM

## 2015-06-01 ENCOUNTER — Ambulatory Visit
Admission: RE | Admit: 2015-06-01 | Discharge: 2015-06-01 | Disposition: A | Discharge status: Home / Self Care | Payer: Medicare Other | Source: Ambulatory Visit | Attending: Family Medicine | Admitting: Family Medicine

## 2015-06-01 DIAGNOSIS — E042 Nontoxic multinodular goiter: Secondary | ICD-10-CM | POA: Diagnosis not present

## 2015-06-01 DIAGNOSIS — E041 Nontoxic single thyroid nodule: Secondary | ICD-10-CM

## 2015-11-16 DIAGNOSIS — E559 Vitamin D deficiency, unspecified: Secondary | ICD-10-CM | POA: Diagnosis not present

## 2015-11-16 DIAGNOSIS — F419 Anxiety disorder, unspecified: Secondary | ICD-10-CM | POA: Diagnosis not present

## 2015-11-16 DIAGNOSIS — M79672 Pain in left foot: Secondary | ICD-10-CM | POA: Diagnosis not present

## 2015-11-16 DIAGNOSIS — M8588 Other specified disorders of bone density and structure, other site: Secondary | ICD-10-CM | POA: Diagnosis not present

## 2015-11-16 DIAGNOSIS — R5383 Other fatigue: Secondary | ICD-10-CM | POA: Diagnosis not present

## 2015-11-16 DIAGNOSIS — E041 Nontoxic single thyroid nodule: Secondary | ICD-10-CM | POA: Diagnosis not present

## 2015-11-16 DIAGNOSIS — I10 Essential (primary) hypertension: Secondary | ICD-10-CM | POA: Diagnosis not present

## 2015-11-16 DIAGNOSIS — N183 Chronic kidney disease, stage 3 (moderate): Secondary | ICD-10-CM | POA: Diagnosis not present

## 2015-11-16 DIAGNOSIS — Z1389 Encounter for screening for other disorder: Secondary | ICD-10-CM | POA: Diagnosis not present

## 2015-11-16 DIAGNOSIS — F339 Major depressive disorder, recurrent, unspecified: Secondary | ICD-10-CM | POA: Diagnosis not present

## 2015-11-16 DIAGNOSIS — E7800 Pure hypercholesterolemia, unspecified: Secondary | ICD-10-CM | POA: Diagnosis not present

## 2015-11-16 DIAGNOSIS — R7301 Impaired fasting glucose: Secondary | ICD-10-CM | POA: Diagnosis not present

## 2015-11-18 ENCOUNTER — Other Ambulatory Visit: Payer: Self-pay

## 2015-11-18 DIAGNOSIS — Z1231 Encounter for screening mammogram for malignant neoplasm of breast: Secondary | ICD-10-CM

## 2015-12-23 DIAGNOSIS — M2012 Hallux valgus (acquired), left foot: Secondary | ICD-10-CM | POA: Diagnosis not present

## 2015-12-23 DIAGNOSIS — M79672 Pain in left foot: Secondary | ICD-10-CM | POA: Diagnosis not present

## 2015-12-23 DIAGNOSIS — M65872 Other synovitis and tenosynovitis, left ankle and foot: Secondary | ICD-10-CM | POA: Diagnosis not present

## 2015-12-23 DIAGNOSIS — M25572 Pain in left ankle and joints of left foot: Secondary | ICD-10-CM | POA: Diagnosis not present

## 2016-01-01 ENCOUNTER — Ambulatory Visit
Admission: RE | Admit: 2016-01-01 | Discharge: 2016-01-01 | Disposition: A | Discharge status: Home / Self Care | Payer: Medicare Other | Source: Ambulatory Visit

## 2016-01-01 DIAGNOSIS — Z1231 Encounter for screening mammogram for malignant neoplasm of breast: Secondary | ICD-10-CM | POA: Diagnosis not present

## 2016-01-06 DIAGNOSIS — M12272 Villonodular synovitis (pigmented), left ankle and foot: Secondary | ICD-10-CM | POA: Diagnosis not present

## 2016-01-06 DIAGNOSIS — M7672 Peroneal tendinitis, left leg: Secondary | ICD-10-CM | POA: Diagnosis not present

## 2016-05-05 DIAGNOSIS — F419 Anxiety disorder, unspecified: Secondary | ICD-10-CM | POA: Diagnosis not present

## 2016-05-05 DIAGNOSIS — T7840XA Allergy, unspecified, initial encounter: Secondary | ICD-10-CM | POA: Diagnosis not present

## 2016-05-05 DIAGNOSIS — Z23 Encounter for immunization: Secondary | ICD-10-CM | POA: Diagnosis not present

## 2016-05-20 DIAGNOSIS — R7301 Impaired fasting glucose: Secondary | ICD-10-CM | POA: Diagnosis not present

## 2016-05-20 DIAGNOSIS — E784 Other hyperlipidemia: Secondary | ICD-10-CM | POA: Diagnosis not present

## 2016-05-20 DIAGNOSIS — R21 Rash and other nonspecific skin eruption: Secondary | ICD-10-CM | POA: Diagnosis not present

## 2016-05-20 DIAGNOSIS — E559 Vitamin D deficiency, unspecified: Secondary | ICD-10-CM | POA: Diagnosis not present

## 2016-05-20 DIAGNOSIS — N183 Chronic kidney disease, stage 3 (moderate): Secondary | ICD-10-CM | POA: Diagnosis not present

## 2016-05-25 DIAGNOSIS — R7301 Impaired fasting glucose: Secondary | ICD-10-CM | POA: Diagnosis not present

## 2016-05-25 DIAGNOSIS — R21 Rash and other nonspecific skin eruption: Secondary | ICD-10-CM | POA: Diagnosis not present

## 2016-05-25 DIAGNOSIS — E784 Other hyperlipidemia: Secondary | ICD-10-CM | POA: Diagnosis not present

## 2016-05-25 DIAGNOSIS — N183 Chronic kidney disease, stage 3 (moderate): Secondary | ICD-10-CM | POA: Diagnosis not present

## 2016-05-25 DIAGNOSIS — E559 Vitamin D deficiency, unspecified: Secondary | ICD-10-CM | POA: Diagnosis not present

## 2016-05-30 DIAGNOSIS — N183 Chronic kidney disease, stage 3 (moderate): Secondary | ICD-10-CM | POA: Diagnosis not present

## 2016-05-30 DIAGNOSIS — R7301 Impaired fasting glucose: Secondary | ICD-10-CM | POA: Diagnosis not present

## 2016-05-30 DIAGNOSIS — E559 Vitamin D deficiency, unspecified: Secondary | ICD-10-CM | POA: Diagnosis not present

## 2016-05-30 DIAGNOSIS — F419 Anxiety disorder, unspecified: Secondary | ICD-10-CM | POA: Diagnosis not present

## 2016-05-30 DIAGNOSIS — I10 Essential (primary) hypertension: Secondary | ICD-10-CM | POA: Diagnosis not present

## 2016-05-30 DIAGNOSIS — E7800 Pure hypercholesterolemia, unspecified: Secondary | ICD-10-CM | POA: Diagnosis not present

## 2016-06-02 ENCOUNTER — Ambulatory Visit (INDEPENDENT_AMBULATORY_CARE_PROVIDER_SITE_OTHER): Payer: Medicare Other | Admitting: Allergy and Immunology

## 2016-06-02 ENCOUNTER — Encounter: Payer: Self-pay | Admitting: Allergy and Immunology

## 2016-06-02 VITALS — BP 104/70 | HR 64 | Temp 97.9°F | Resp 16 | Ht 67.13 in | Wt 178.8 lb

## 2016-06-02 DIAGNOSIS — J310 Chronic rhinitis: Secondary | ICD-10-CM | POA: Diagnosis not present

## 2016-06-02 DIAGNOSIS — L309 Dermatitis, unspecified: Secondary | ICD-10-CM | POA: Diagnosis not present

## 2016-06-02 MED ORDER — MOMETASONE FUROATE 0.1 % EX OINT: TOPICAL_OINTMENT | CUTANEOUS | 3 refills | Status: DC

## 2016-06-02 MED ORDER — AZELASTINE HCL 0.1 % NA SOLN: NASAL | 5 refills | Status: DC

## 2016-06-02 MED ORDER — LEVOCETIRIZINE DIHYDROCHLORIDE 5 MG PO TABS: 5.0000 mg | ORAL_TABLET | Freq: Every evening | ORAL | 5 refills | Status: DC

## 2016-06-02 NOTE — Assessment & Plan Note (Signed)
Non-allergic rhinitis.  Seasonal and perennial aeroallergen skin tests are negative despite a positive histamine control.  Intranasal steroids and intranasal antihistamines are effective for symptoms associated with non-allergic rhinitis.  A prescription has been provided for azelastine nasal spray, one spray per nostril 1-2 times daily as needed. Proper nasal spray technique has been discussed and demonstrated.  I have also recommended nasal saline spray (i.e., Simply Saline) or nasal saline lavage (i.e., NeilMed) as needed prior to medicated nasal sprays.

## 2016-06-02 NOTE — Progress Notes (Signed)
New Patient Note  RE: Evelyn Heath MRN: [id number] DOB: [birth year] Date of Office Visit: 06/02/2016  Referring provider: Leighton Ruff, MD Primary care provider: Gerrit Heck, MD  Chief Complaint: Rash and Nasal Congestion   History of present illness: Evelyn Heath is a 70 y.o. female seen today in consultation requested by Evelyn Ruff, MD. approximately for 5 weeks ago, she began to develop a rash on her upper chest, back, and posterior neck.  The rash was described as pruritic, erythematous, with "tiny bumps."  She denies urticaria, concomitant cardiopulmonary symptoms or GI symptoms.  She does not believe that vesicles have been present.  She denies contact with poison ivy/oak/sumac.  The rash completely resolved with prednisone, however gradually returned on her chest and upper back after the prednisone course was completed.  She has tried over-the-counter cortisone cream and diphenhydramine gel with mild/incomplete relief. Evelyn Heath experiences nasal congestion, rhinorrhea, sneezing, postnasal drainage, and occasional sinus pressure.  These symptoms occur year round but tend to be most prominent during rapid weather changes.   Assessment and plan: Dermatitis Unclear etiology. Possible allergic contact dermatitis. Environmental and food allergen skin tests were negative today despite a positive histamine control.  A prescription has been provided for mometasone 0.1% ointment sparingly to affected areas daily as needed.  A prescription has been provided for levocetirizine, 5mg  daily as needed.  The patient will return next week for TRUE patch test placement.  Chronic rhinitis Non-allergic rhinitis.  Seasonal and perennial aeroallergen skin tests are negative despite a positive histamine control.  Intranasal steroids and intranasal antihistamines are effective for symptoms associated with non-allergic rhinitis.  A prescription has been provided for azelastine  nasal spray, one spray per nostril 1-2 times daily as needed. Proper nasal spray technique has been discussed and demonstrated.  I have also recommended nasal saline spray (i.e., Simply Saline) or nasal saline lavage (i.e., NeilMed) as needed prior to medicated nasal sprays.   Meds ordered this encounter  Medications  . mometasone (ELOCON) 0.1 % ointment    Sig: Apply once daily to red itchy areas below face    Dispense:  45 g    Refill:  3  . levocetirizine (XYZAL) 5 MG tablet    Sig: Take 1 tablet (5 mg total) by mouth every evening.    Dispense:  34 tablet    Refill:  5    Runny nose or itching  . azelastine (ASTELIN) 0.1 % nasal spray    Sig: One spray per nostril 1-2 times daily as needed    Dispense:  30 mL    Refill:  5    For runny nose    Diagnostics: Environmental skin testing:  Negative despite a positive histamine control. Food allergen skin testing:  Negative despite a positive histamine control.    Physical examination: Blood pressure 104/70, pulse 64, temperature 97.9 F (36.6 C), temperature source Oral, resp. rate 16, height 5' 7.13" (1.705 m), weight 178 lb 12.7 oz (81.1 kg).  General: Alert, interactive, in no acute distress. HEENT: TMs pearly gray, turbinates moderately edematous without discharge, post-pharynx mildly erythematous. Neck: Supple without lymphadenopathy. Lungs: Clear to auscultation without wheezing, rhonchi or rales. CV: Normal S1, S2 without murmurs. Abdomen: Nondistended, nontender. Skin: Erythematous, excoriated patches on the upper chest and upper back. Extremities:  No clubbing, cyanosis or edema. Neuro:   Grossly intact.  Review of systems:  Review of systems negative except as noted in HPI / PMHx or noted below: Review of Systems  Constitutional: Negative.   HENT: Negative.   Eyes: Negative.   Respiratory: Negative.   Cardiovascular: Negative.   Gastrointestinal: Negative.   Genitourinary: Negative.   Musculoskeletal:  Negative.   Skin: Negative.   Neurological: Negative.   Endo/Heme/Allergies: Negative.   Psychiatric/Behavioral: Negative.     Past medical history:  Past Medical History:  Diagnosis Date  . Anxiety   . Borderline osteopenia   . Elevated cholesterol with elevated triglycerides   . History of shingles 10/07; 1/08; 2/08   per patient  . Hyperlipidemia   . Hypertension   . Insomnia, persistent   . Menopausal state 2000   no HRT  . Osteoporosis   . Vitamin deficiency     Past surgical history:  Past Surgical History:  Procedure Laterality Date  . ABDOMINAL EXPLORATION SURGERY  1981/ 1982   following ectopic pregnancy  . KNEE ARTHROSCOPY W/ ACL RECONSTRUCTION     left  . KNEE SURGERY     left    Family history: Family History  Problem Relation Age of Onset  . Cancer Mother     Bone primary site unknown  . Cancer Father 63    CLL  . Diabetes Father 42  . Cancer Maternal Grandmother     Breast  . Angioedema Neg Hx   . Allergic rhinitis Neg Hx   . Asthma Neg Hx   . Eczema Neg Hx   . Immunodeficiency Neg Hx   . Urticaria Neg Hx     Social history: Social History   Social History  . Marital status: Married    Spouse name: Evelyn Heath  . Number of children: 1  . Years of education: N/A   Occupational History  . 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 Smoking status: Former Smoker    Packs/day: 0.50    Types: Cigarettes  . Smokeless tobacco: Never Used  . Alcohol use Yes     Comment: rare wine  . Drug use: No  . Sexual activity: Yes    Partners: Male    Birth control/ protection: Post-menopausal   Other Topics Concern  . Not on file   Social History Narrative  . No narrative on file   Environmental History: The patient lives in a 69 year old house with carpeting throughout, gas heat, and central air.  There is a cat in the house which has access to her bedroom.  She is a nonsmoker.      Medication List       Accurate as of 06/02/16  1:12 PM. Always use your most recent med list.          ALPRAZolam 0.25 MG tablet Commonly known as:  XANAX Take 0.25 mg by mouth at bedtime as needed.   azelastine 0.1 % nasal spray Commonly known as:  ASTELIN One spray per nostril 1-2 times daily as needed   cholecalciferol 1000 units tablet Commonly known as:  VITAMIN D Take 2,000 Units by mouth daily.   FLUoxetine 20 MG capsule Commonly known as:  PROZAC   levocetirizine 5 MG tablet Commonly known as:  XYZAL Take 1 tablet (5 mg total) by mouth every evening.   loratadine 10 MG tablet Commonly known as:  CLARITIN Take 10 mg by mouth daily.   Melatonin 1 MG Caps Take by mouth at bedtime.   mometasone 0.1 % ointment Commonly known as:  ELOCON Apply once daily to red itchy areas below  face   omeprazole 20 MG capsule Commonly known as:  PRILOSEC Take 20 mg by mouth 2 (two) times a week.   VITAMIN B COMPLEX PO Take by mouth.       Known medication allergies: Allergies  Allergen Reactions  . Bactrim [Sulfamethoxazole-Trimethoprim] Nausea Only  . Lipitor [Atorvastatin]     Numbness in fingers  . Statins     numbness  . Terramycin [Oxytetracycline]   . Codeine Rash  . Crestor [Rosuvastatin]   . Tetracyclines & Related Rash    Rash in mouth    I appreciate the opportunity to take part in Brittan's care. Please do not hesitate to contact me with questions.  Sincerely,   R. Edgar Frisk, MD

## 2016-06-02 NOTE — Patient Instructions (Addendum)
Dermatitis Unclear etiology. Possible allergic contact dermatitis. Environmental and food allergen skin tests were negative today despite a positive histamine control.  A prescription has been provided for mometasone 0.1% ointment sparingly to affected areas daily as needed.  A prescription has been provided for levocetirizine, 5mg  daily as needed.  The patient will return next week for TRUE patch test placement.  Chronic rhinitis Non-allergic rhinitis.  Seasonal and perennial aeroallergen skin tests are negative despite a positive histamine control.  Intranasal steroids and intranasal antihistamines are effective for symptoms associated with non-allergic rhinitis.  A prescription has been provided for azelastine nasal spray, one spray per nostril 1-2 times daily as needed. Proper nasal spray technique has been discussed and demonstrated.  I have also recommended nasal saline spray (i.e., Simply Saline) or nasal saline lavage (i.e., NeilMed) as needed prior to medicated nasal sprays.   Return for for patch test placement next Wednesday.

## 2016-06-02 NOTE — Assessment & Plan Note (Signed)
Unclear etiology. Possible allergic contact dermatitis. Environmental and food allergen skin tests were negative today despite a positive histamine control.  A prescription has been provided for mometasone 0.1% ointment sparingly to affected areas daily as needed.  A prescription has been provided for levocetirizine, 5mg  daily as needed.  The patient will return next week for TRUE patch test placement.

## 2016-06-08 ENCOUNTER — Other Ambulatory Visit: Payer: Self-pay | Admitting: Family Medicine

## 2016-06-08 ENCOUNTER — Encounter: Payer: Medicare Other | Admitting: Allergy and Immunology

## 2016-06-08 DIAGNOSIS — E041 Nontoxic single thyroid nodule: Secondary | ICD-10-CM

## 2016-06-10 ENCOUNTER — Encounter: Payer: Medicare Other | Admitting: Allergy & Immunology

## 2016-06-14 ENCOUNTER — Ambulatory Visit
Admission: RE | Admit: 2016-06-14 | Discharge: 2016-06-14 | Disposition: A | Discharge status: Home / Self Care | Payer: Medicare Other | Source: Ambulatory Visit | Attending: Family Medicine | Admitting: Family Medicine

## 2016-06-14 DIAGNOSIS — E042 Nontoxic multinodular goiter: Secondary | ICD-10-CM | POA: Diagnosis not present

## 2016-06-14 DIAGNOSIS — E041 Nontoxic single thyroid nodule: Secondary | ICD-10-CM

## 2016-06-22 ENCOUNTER — Encounter: Payer: Self-pay | Admitting: Allergy and Immunology

## 2016-06-22 ENCOUNTER — Ambulatory Visit (INDEPENDENT_AMBULATORY_CARE_PROVIDER_SITE_OTHER): Payer: Medicare Other | Admitting: Allergy and Immunology

## 2016-06-22 VITALS — BP 140/84 | HR 64 | Temp 98.0°F | Resp 16 | Ht 67.13 in | Wt 182.5 lb

## 2016-06-22 DIAGNOSIS — J310 Chronic rhinitis: Secondary | ICD-10-CM

## 2016-06-22 DIAGNOSIS — L309 Dermatitis, unspecified: Secondary | ICD-10-CM | POA: Diagnosis not present

## 2016-06-22 NOTE — Assessment & Plan Note (Signed)
   T.R.U.E. patch test panel has been placed.  Instructions have been provided regarding keeping the patches clean and dry as well as returning at appropriate intervals for patch test interpretation. 

## 2016-06-22 NOTE — Progress Notes (Signed)
    Follow-up Note  RE: MONCHEL ATALLA MRN: [id number] DOB: [birth year] Date of Office Visit: 06/22/2016  Primary care provider: Gerrit Heck, MD Referring provider: Leighton Ruff, MD  History of present illness: Evelyn Heath is a 70 y.o. female with dermatitis and chronic rhinitis presenting today for follow up and patch test placement.  She has been off of antihistamines for the past 3 days in anticipation of today's testing.  She has no new cutaneous or nasal symptom complaints today.   Assessment and plan: Dermatitis  T.R.U.E. patch test panel has been placed.  Instructions have been provided regarding keeping the patches clean and dry as well as returning at appropriate intervals for patch test interpretation.  Chronic rhinitis  Continue nasal saline irrigation as needed.  She will hold azelastine nasal spray and all other antihistamines until patch testing has been completed.   Diagnostics: TRUE Patch tests were placed.    Physical examination: Blood pressure 140/84, pulse 64, temperature 98 F (36.7 C), temperature source Oral, resp. rate 16, height 5' 7.13" (1.705 m), weight 182 lb 8.7 oz (82.8 kg).  General: Alert, interactive, in no acute distress. HEENT: TMs pearly gray, turbinates mildly edematous without discharge, post-pharynx mildly erythematous. Neck: Supple without lymphadenopathy. Lungs: Clear to auscultation without wheezing, rhonchi or rales. CV: Normal S1, S2 without murmurs. Skin: Mildly erythematous patches on the chest.  The following portions of the patient's history were reviewed and updated as appropriate: allergies, current medications, past family history, past medical history, past social history, past surgical history and problem list.    Medication List       Accurate as of 06/22/16  9:30 AM. Always use your most recent med list.          ALPRAZolam 0.25 MG tablet Commonly known as:  XANAX Take 0.25 mg by mouth at  bedtime as needed.   azelastine 0.1 % nasal spray Commonly known as:  ASTELIN One spray per nostril 1-2 times daily as needed   cholecalciferol 1000 units tablet Commonly known as:  VITAMIN D Take 2,000 Units by mouth daily.   FLUoxetine 20 MG capsule Commonly known as:  PROZAC   levocetirizine 5 MG tablet Commonly known as:  XYZAL Take 1 tablet (5 mg total) by mouth every evening.   Melatonin 1 MG Caps Take by mouth at bedtime.   mometasone 0.1 % ointment Commonly known as:  ELOCON Apply once daily to red itchy areas below face   omeprazole 20 MG capsule Commonly known as:  PRILOSEC Take 20 mg by mouth 2 (two) times a week.   VITAMIN B COMPLEX PO Take by mouth.       Allergies  Allergen Reactions  . Bactrim [Sulfamethoxazole-Trimethoprim] Nausea Only  . Lipitor [Atorvastatin]     Numbness in fingers  . Statins     numbness  . Terramycin [Oxytetracycline]   . Codeine Rash  . Crestor [Rosuvastatin]   . Tetracyclines & Related Rash    Rash in mouth    I appreciate the opportunity to take part in Trezure's care. Please do not hesitate to contact me with questions.  Sincerely,   R. Edgar Frisk, MD

## 2016-06-22 NOTE — Patient Instructions (Addendum)
Dermatitis  You had slightly positive reactions to #6, #20, and #28.   Infomational handouts provided on each of the substances.   Return in about 3 days (around 06/27/2016) for patch test interpretation #2.

## 2016-06-22 NOTE — Assessment & Plan Note (Signed)
   Continue nasal saline irrigation as needed.  She will hold azelastine nasal spray and all other antihistamines until patch testing has been completed.

## 2016-06-24 ENCOUNTER — Encounter: Payer: Medicare Other | Admitting: Allergy & Immunology

## 2016-06-24 NOTE — Progress Notes (Signed)
    Follow-up Note  RE: Evelyn Heath MRN: [id number] DOB: [birth year] Date of Office Visit: 06/22/2016  Primary care provider: Gerrit Heck, MD Referring provider: Leighton Ruff, MD   [facility] returns to the office today for the initial patch test interpretation, given suspected history of contact dermatitis.    Diagnostics:  TRUE TEST 48 hour reading: doubtful reactions to #6 (fragrance mix), #20 (p-Phenylenediamine), and #28 (Gold Sodium Thiosulfate)  Plan:  Allergic contact dermatitis - The patient has been provided detailed information regarding the substances she is sensitive to, as well as products containing the substances.   - Meticulous avoidance of these substances is recommended.  - If avoidance is not possible, the use of barrier creams or lotions is recommended. - If symptoms persist or progress despite meticulous avoidance of the above substances, dermatology evaluation may be warranted.   Salvatore Marvel, MD [facility]

## 2016-06-27 ENCOUNTER — Encounter: Payer: Medicare Other | Admitting: Pediatrics

## 2016-06-30 ENCOUNTER — Encounter (INDEPENDENT_AMBULATORY_CARE_PROVIDER_SITE_OTHER): Payer: Self-pay

## 2016-06-30 ENCOUNTER — Encounter: Payer: Self-pay | Admitting: Cardiology

## 2016-06-30 ENCOUNTER — Ambulatory Visit (INDEPENDENT_AMBULATORY_CARE_PROVIDER_SITE_OTHER): Payer: Medicare Other | Admitting: Cardiology

## 2016-06-30 ENCOUNTER — Other Ambulatory Visit: Payer: Self-pay | Admitting: Family Medicine

## 2016-06-30 VITALS — BP 120/70 | HR 63 | Ht 67.0 in | Wt 180.8 lb

## 2016-06-30 DIAGNOSIS — Z789 Other specified health status: Secondary | ICD-10-CM

## 2016-06-30 DIAGNOSIS — E784 Other hyperlipidemia: Secondary | ICD-10-CM | POA: Diagnosis not present

## 2016-06-30 DIAGNOSIS — E041 Nontoxic single thyroid nodule: Secondary | ICD-10-CM

## 2016-06-30 DIAGNOSIS — E7849 Other hyperlipidemia: Secondary | ICD-10-CM

## 2016-06-30 NOTE — Progress Notes (Signed)
Cardiology Office Note    Date:  06/30/2016   ID:  Evelyn, Heath 31-Aug-1945, MRN [id number]  PCP:  Evelyn Heck, MD  Cardiologist:   Evelyn Furbish, MD     History of Present Illness:  Evelyn Heath is a 70 y.o. female here for evaluation of difficult to control lipids with prior statin intolerance.  LDL 237, non-HDL 266, HDL 66, triglycerides 145, total cholesterol 332 on 05/25/16 performed at [facility] with Dr. Drema Heath. Unable to tolerate statins, Zetia, WelChol. She is working with [employer] and plans to start Nutrisystem. Anxiety, Prozac. Retired [employer]. Hemoglobin A1c 5.9. Creatinine 1.1, vitamin D 37, normal. ALT 15.  Lipitor - left 4-5 digits woke up numb Crestor - cramps Welchol - numbness in legs Zetia - leg numbness.   No early FHX of CAD. Paternal grandfather died CHF. Brother and 2 sisters with no cardiac issues.   No diabetes, no current smoking, quit in 2013-just stopped.  Tried statins first in the 1990's.  She describes no chest pain, no shortness breath, no significant, no bleeding, no orthopnea, no PND.    Past Medical History:  Diagnosis Date  . Anxiety   . Borderline osteopenia   . Elevated cholesterol with elevated triglycerides   . History of shingles 10/07; 1/08; 2/08   per patient  . Hyperlipidemia   . Hypertension   . Insomnia, persistent   . Menopausal state 2000   no HRT  . Osteoporosis   . Vitamin deficiency     Past Surgical History:  Procedure Laterality Date  . ABDOMINAL EXPLORATION SURGERY  1981/ 1982   following ectopic pregnancy  . KNEE ARTHROSCOPY W/ ACL RECONSTRUCTION     left  . KNEE SURGERY     left    Current Medications: Outpatient Medications Prior to Visit  Medication Sig Dispense Refill  . ALPRAZolam (XANAX) 0.25 MG tablet Take 0.25 mg by mouth at bedtime as needed.    Marland Kitchen azelastine (ASTELIN) 0.1 % nasal spray One spray per nostril 1-2 times daily as needed 30 mL 5  . B Complex Vitamins  (VITAMIN B COMPLEX PO) Take by mouth.    . cholecalciferol (VITAMIN D) 1000 UNITS tablet Take 2,000 Units by mouth daily.    Marland Kitchen FLUoxetine (PROZAC) 20 MG capsule     . levocetirizine (XYZAL) 5 MG tablet Take 1 tablet (5 mg total) by mouth every evening. 34 tablet 5  . Melatonin 1 MG CAPS Take by mouth at bedtime.    . mometasone (ELOCON) 0.1 % ointment Apply once daily to red itchy areas below face 45 g 3  . omeprazole (PRILOSEC) 20 MG capsule Take 20 mg by mouth 2 (two) times a week.     No facility-administered medications prior to visit.      Allergies:   Bactrim [sulfamethoxazole-trimethoprim]; Lipitor [atorvastatin]; Meloxicam; Statins; Terramycin [oxytetracycline]; Codeine; Crestor [rosuvastatin]; and Tetracyclines & related   Social History   Social History  . Marital status: Married    Spouse name: Evelyn Heath  . Number of children: 1  . Years of education: N/A   Occupational History  . 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 Smoking status: Former Smoker    Packs/day: 0.50    Types: Cigarettes  . Smokeless tobacco: Never Used  . Alcohol use Yes     Comment: rare wine  . Drug use: No  . Sexual activity: Yes  Partners: Male    Birth control/ protection: Post-menopausal   Other Topics Concern  . Not on file   Social History Narrative  . No narrative on file     Family History:  The patient's family history includes Cancer in her maternal grandmother and mother; Cancer (age of onset: 30) in her father; Diabetes (age of onset: 57) in her father.   ROS:   Please see the history of present illness.    ROS All other systems reviewed and are negative.   PHYSICAL EXAM:   VS:  BP 120/70 (BP Location: Right Arm, Cuff Size: Normal)   Pulse 63   Ht 5\' 7"  (1.702 m)   Wt 180 lb 12.8 oz (82 kg)   LMP  (LMP Unknown)   BMI 28.32 kg/m    GEN: Well nourished, well developed, in no acute distress  HEENT: normal  Neck:  no JVD,+ left carotid bruit, no masses Cardiac: RRR; no murmurs, rubs, or gallops,no edema  Respiratory:  clear to auscultation bilaterally, normal work of breathing GI: soft, nontender, nondistended, + BS MS: no deformity or atrophy  Skin: warm and dry, no rash Neuro:  Alert and Oriented x 3, Strength and sensation are intact Psych: euthymic mood, full affect  Wt Readings from Last 3 Encounters:  06/30/16 180 lb 12.8 oz (82 kg)  06/22/16 182 lb 8.7 oz (82.8 kg)  06/02/16 178 lb 12.7 oz (81.1 kg)      Studies/Labs Reviewed:   EKG:  EKG is ordered today.  The ekg ordered today demonstrates 06/30/16-sinus rhythm, 63, no other abnormalities. Personally viewed.  Recent Labs: No results found for requested labs within last 8760 hours.   Lipid Panel No results found for: CHOL, TRIG, HDL, CHOLHDL, VLDL, LDLCALC, LDLDIRECT  Additional studies/ records that were reviewed today include:  Office notes from Dr. Drema Heath reviewed, lab work reviewed, EKG reviewed    ASSESSMENT:    1. Familial hyperlipidemia   2. Statin intolerance      PLAN:  In order of problems listed above:  Familial hyperlipidemia/statin intolerance  - LDL cholesterol of 230 range. HDL 66.  - Has had statin intolerance. Has tried several.  - We will have her sit down with our lipid clinic to discuss possible options, PCSK9 inhibitors for instance. Perhaps Repatha.   - She has 2 sisters, one brother, and both parents none of which have had cardiovascular illness. She does not believe that her siblings have hyperlipidemia however.  Left carotid bruit  - Per patient, vascular studies normal without any carotid stenosis. Dr. Drema Heath has ordered recently.  Thyroid nodule  - If she needs a biopsy, this is fine.   Medication Adjustments/Labs and Tests Ordered: Current medicines are reviewed at length with the patient today.  Concerns regarding medicines are outlined above.  Medication changes, Labs and Tests ordered  today are listed in the Patient Instructions below. Patient Instructions  Medication Instructions:  The current medical regimen is effective;  continue present plan and medications.  Please schedule to be seen in our Lipid Clinic for treatment of your hyperlipidemia.  Follow-Up: Follow up in 6 months with Dr. Marlou Porch.  You will receive a letter in the mail 2 months before you are due.  Please call us when you receive this letter to schedule your follow up appointment.  If you need a refill on your cardiac medications before your next appointment, please call your pharmacy.  Thank you for choosing [facility]!!  Signed, Evelyn Furbish, MD  06/30/2016 12:27 PM    [facility] [place], [place], [place]  [address] Phone: [phone]; Fax: [phone]

## 2016-06-30 NOTE — Patient Instructions (Signed)
Medication Instructions:  The current medical regimen is effective;  continue present plan and medications.  Please schedule to be seen in our Lipid Clinic for treatment of your hyperlipidemia.  Follow-Up: Follow up in 6 months with Dr. Marlou Porch.  You will receive a letter in the mail 2 months before you are due.  Please call us when you receive this letter to schedule your follow up appointment.  If you need a refill on your cardiac medications before your next appointment, please call your pharmacy.  Thank you for choosing [facility]!!

## 2016-07-01 NOTE — Progress Notes (Signed)
    Follow-up Note (read by Dr. Shaune Leeks on 06/27/16)  RE: Evelyn Heath MRN: [id number] DOB: 28-Sep-1945 Date of Office Visit: 06/22/2016  Primary care provider: Gerrit Heck, MD Referring provider: Leighton Ruff, MD   [facility] returns to the office today for the final patch test interpretation, given suspected history of contact dermatitis.    Diagnostics:  TRUE TEST 120 hour reading: positive to #20 (phenylediamine). Negative to #28 (gold thiosulfate). Positive to #35 (disperse blue 106).  Negative to #6 (fragrance mix).   Assessment and Plan:   1. Informational sheet provided on phenylenediamene and disperse blue.    Salvatore Marvel, MD [facility]

## 2016-07-04 NOTE — Progress Notes (Signed)
Patient ID: BRENLEY PRIORE                 DOB: [birth year]                    MRN: [id number]     HPI: Evelyn Heath is a 70 y.o. female patient referred to lipid clinic by Dr Marlou Porch. PMH is significant for HLD, HTN, anxiety, and osteoporosis. Bilateral carotid ultrasound from July 2007 showed mild bilateral carotid atherosclerosis. No other more recent cardiac testing available. Patient has a history of statin intolerance and presents today for further lipid management.   Patient has previously tried Crestor and Lipitor. She experienced hand numbness when she took Lipitor for 1 year. She immediately stopped taking Lipitor and the numbness resolved after 2-3 days. She has tried Crestor and lovastatin as well but experienced joint and muscle aches with these. Zetia and Welchol both caused muscle aches too. She is concerned about taking another statin because she has some renal dysfunction. Labs drawn at PCP in November 2017 showed eGFR of 47 and SCr 1.14.  Other options are limited since patient is primary prevention. Checked her Dutch score to see if she has familial hypercholesterolemia, score was on 3 due to elevated LDL. Patient has no family history of cardiac disease. Patient's only risk factor other than elevated LDL is a carotid ultrasound from 2007 that showed mild bilateral carotid atherosclerosis, usually need 50% occlusion or greater to qualify as CAD for PCSK9i approvals. Clinical trial options are also currently limited given this factor. Pt also has [employer], however cost of PCSK9i does not seem cost prohibitive if pt can qualify for them.  Current Medications: none Intolerances: Lipitor - hand numbness, Crestor 69m daily (2011) - myalgias, lovastatin 24mdaily (2014) - myalgias, Zetia 1044maily and Welchol 1,875m48mD - myalgias  Risk Factors: LDL > 200, mild bilateral carotid atherosclerosis  LDL goal: <100mg12mcurrently for primary prevention with some mild carotid  plaque. Will further assess pt's risk and reevaluate goal if needed  Diet: Working with [employer] and plans to start [employer]. Does not eat fried foods.  Exercise: Has a bad back and knees but does like to swim.  Family History: No early family history of CAD. Paternal grandfather died of CHF. Brother and 2 sisters with no cardiac issues.  Social History: Retired HR exClinical biochemistt smoking.  Labs: 05/25/16: TC 332, HDL 66, TG 145, LDL 237, [facility] BarneDrema Dallast Medical History:  Diagnosis Date  . Anxiety   . Borderline osteopenia   . Elevated cholesterol with elevated triglycerides   . History of shingles 10/07; 1/08; 2/08   per patient  . Hyperlipidemia   . Hypertension   . Insomnia, persistent   . Menopausal state 2000   no HRT  . Osteoporosis   . Vitamin deficiency     Current Outpatient Prescriptions on File Prior to Visit  Medication Sig Dispense Refill  . ALPRAZolam (XANAX) 0.25 MG tablet Take 0.25 mg by mouth at bedtime as needed.    . azeMarland Kitchenastine (ASTELIN) 0.1 % nasal spray One spray per nostril 1-2 times daily as needed 30 mL 5  . B Complex Vitamins (VITAMIN B COMPLEX PO) Take by mouth.    . cholecalciferol (VITAMIN D) 1000 UNITS tablet Take 2,000 Units by mouth daily.    . FLUMarland Kitchenxetine (PROZAC) 20 MG capsule     . levocetirizine (XYZAL) 5 MG  tablet Take 1 tablet (5 mg total) by mouth every evening. 34 tablet 5  . Melatonin 1 MG CAPS Take by mouth at bedtime.    . mometasone (ELOCON) 0.1 % ointment Apply once daily to red itchy areas below face 45 g 3  . omeprazole (PRILOSEC) 20 MG capsule Take 20 mg by mouth 2 (two) times a week.     No current facility-administered medications on file prior to visit.     Allergies  Allergen Reactions  . Bactrim [Sulfamethoxazole-Trimethoprim] Nausea Only  . Lipitor [Atorvastatin]     Numbness in fingers  . Meloxicam     Upset stomach  . Statins     numbness  . Terramycin  [Oxytetracycline]   . Codeine Rash  . Crestor [Rosuvastatin]   . Tetracyclines & Related Rash    Rash in mouth    Assessment/Plan:  1. Hyperlipidemia - LDL extremely elevated at 226m/dL on no lipid lowering therapy. Pt is statin intolerant to Crestor and Lipitor. She is negative for familial hypercholesterolemia with DNamibiaScore of only 3 and does not have ASCVD which currently prevents use of PCSK9i. Will check coronary calcium score to further assess patient's risk. May potentially be able to qualify for a clinical trial pending results. She does seem willing to rechallenge with low dose statin a few days a week if needed. Will discuss with pt after calcium score next week.   Evelyn Heath E. Supple, PharmD, CPP, [id number]. [address] [place] [address]Phone: [phone] Fax: [phone]/13/2017 7:48 AM

## 2016-07-05 ENCOUNTER — Ambulatory Visit (INDEPENDENT_AMBULATORY_CARE_PROVIDER_SITE_OTHER): Payer: Medicare Other | Admitting: Pharmacist

## 2016-07-05 DIAGNOSIS — E785 Hyperlipidemia, unspecified: Secondary | ICD-10-CM

## 2016-07-05 NOTE — Patient Instructions (Signed)
We will check a coronary calcium score to assess your cardiac risk.  Will pursue either clinical trial or cholesterol injections pending test results.  Call Tameia Rafferty in lipid clinic with any questions [phone].

## 2016-07-06 ENCOUNTER — Encounter: Payer: Self-pay | Admitting: Cardiology

## 2016-07-06 ENCOUNTER — Other Ambulatory Visit (HOSPITAL_COMMUNITY)
Admission: RE | Admit: 2016-07-06 | Discharge: 2016-07-06 | Disposition: A | Discharge status: Home / Self Care | Payer: Medicare Other | Source: Ambulatory Visit | Attending: Radiology | Admitting: Radiology

## 2016-07-06 ENCOUNTER — Ambulatory Visit
Admission: RE | Admit: 2016-07-06 | Discharge: 2016-07-06 | Disposition: A | Discharge status: Home / Self Care | Payer: Medicare Other | Source: Ambulatory Visit | Attending: Family Medicine | Admitting: Family Medicine

## 2016-07-06 DIAGNOSIS — E041 Nontoxic single thyroid nodule: Secondary | ICD-10-CM | POA: Insufficient documentation

## 2016-07-12 ENCOUNTER — Ambulatory Visit (INDEPENDENT_AMBULATORY_CARE_PROVIDER_SITE_OTHER)
Admission: RE | Admit: 2016-07-12 | Discharge: 2016-07-12 | Disposition: A | Discharge status: Home / Self Care | Payer: Self-pay | Source: Ambulatory Visit | Attending: Cardiology | Admitting: Cardiology

## 2016-07-12 DIAGNOSIS — E785 Hyperlipidemia, unspecified: Secondary | ICD-10-CM

## 2016-07-13 NOTE — Progress Notes (Signed)
Patient ID: Evelyn Heath                 DOB: 06-26-1946                    MRN: [id number]     HPI: Evelyn Heath is a 70 y.o. female patient referred to lipid clinic by Dr Marlou Porch who presents today for follow up. PMH is significant for HLD, HTN, anxiety, and osteoporosis. Bilateral carotid ultrasound from July 2007 showed mild bilateral carotid atherosclerosis. Pt has a history of statin intolerance and baseline LDL > 200. She was negative for familial hypercholesterolemia due to [place] score of only 3. At last OV, discussed limited options since pt would not qualify for PCSK9i as a primary prevention patient. She elected to undergo coronary calcium testing to assess for potential CAD given extremely elevated baseline LDL. Results from coronary calcium testing done yesterday showed mild diffuse calcifications with coronary calcium score of 128 which was 77th percentile matched for age and sex. Dr Marlou Porch reviewed results and determined that pt is positive for coronary atherosclerosis based upon her calcium score. Since pt now has a diagnosis of CAD, she presents today to discuss lipid options.  Patient has previously tried Crestor and Lipitor. She experienced hand numbness when she took Lipitor for 1 year. She immediately stopped taking Lipitor and the numbness resolved after 2-3 days. She has tried Crestor and lovastatin as well but experienced joint and muscle aches with these. Zetia and Welchol both caused muscle aches too. She is concerned about taking another statin because she has some renal dysfunction. Labs drawn at PCP in November 2017 showed eGFR of 47 and SCr 1.14.  Current Medications: none Intolerances: Lipitor - hand numbness, Crestor 74m daily (2011) - myalgias, lovastatin 210mdaily (2014) - myalgias, Zetia 1095maily and Welchol 1,875m79mD - myalgias  Risk Factors: LDL > 200, mild bilateral carotid atherosclerosis  LDL goal: <100mg55mcurrently for primary prevention with some  mild carotid plaque. Will further assess pt's risk and reevaluate goal if needed  Diet: Working with [employer] and plans to start [employer]. Does not eat fried foods.  Exercise: Has a bad back and knees but does like to swim.  Family History: No early family history of CAD. Paternal grandfather died of CHF. Brother and 2 sisters with no cardiac issues.  Social History: Retired HR exClinical biochemistt smoking.  Labs: 05/25/16: TC 332, HDL 66, TG 145, LDL 237, non[facility]HDL 266 [facility] BarneDrema Dallast Medical History:  Diagnosis Date  . Anxiety   . Borderline osteopenia   . Elevated cholesterol with elevated triglycerides   . History of shingles 10/07; 1/08; 2/08   per patient  . Hyperlipidemia   . Hypertension   . Insomnia, persistent   . Menopausal state 2000   no HRT  . Osteoporosis   . Vitamin deficiency     Current Outpatient Prescriptions on File Prior to Visit  Medication Sig Dispense Refill  . ALPRAZolam (XANAX) 0.25 MG tablet Take 0.25 mg by mouth at bedtime as needed.    . azeMarland Kitchenastine (ASTELIN) 0.1 % nasal spray One spray per nostril 1-2 times daily as needed 30 mL 5  . B Complex Vitamins (VITAMIN B COMPLEX PO) Take by mouth.    . cholecalciferol (VITAMIN D) 1000 UNITS tablet Take 2,000 Units by mouth daily.    . FLUMarland Kitchenxetine (PROZAC) 20 MG capsule     . levocetirizine (  XYZAL) 5 MG tablet Take 1 tablet (5 mg total) by mouth every evening. 34 tablet 5  . Melatonin 1 MG CAPS Take by mouth at bedtime.    . mometasone (ELOCON) 0.1 % ointment Apply once daily to red itchy areas below face 45 g 3  . omeprazole (PRILOSEC) 20 MG capsule Take 20 mg by mouth 2 (two) times a week.     No current facility-administered medications on file prior to visit.     Allergies  Allergen Reactions  . Bactrim [Sulfamethoxazole-Trimethoprim] Nausea Only  . Lipitor [Atorvastatin]     Numbness in fingers  . Meloxicam     Upset stomach  . Statins     numbness  .  Terramycin [Oxytetracycline]   . Codeine Rash  . Crestor [Rosuvastatin]   . Tetracyclines & Related Rash    Rash in mouth    Assessment/Plan:  1. Hyperlipidemia - LDL 237 above goal <23m/dL given new diagnosis of CAD after recent elevated coronary calcium score. Patient is intolerant to 4 statins and Zetia. Discussed expected benefits, side effects, and injection technique of PCSK9i. Will submit prior authorization for Repatha. Pt states cost will not be a problem.  Patient signed informed consent for [facility] lipid registry.   Megan E. Supple, PharmD, CPP, [id number]. [address] [place] [address]Phone: [phone] Fax: [phone]/21/2017 10:36 AM

## 2016-07-14 ENCOUNTER — Telehealth: Payer: Self-pay | Admitting: Pharmacist

## 2016-07-14 ENCOUNTER — Ambulatory Visit (INDEPENDENT_AMBULATORY_CARE_PROVIDER_SITE_OTHER): Payer: Medicare Other | Admitting: Pharmacist

## 2016-07-14 ENCOUNTER — Encounter: Payer: Self-pay | Admitting: *Deleted

## 2016-07-14 VITALS — BP 132/64 | HR 69 | Ht 67.0 in | Wt 185.0 lb

## 2016-07-14 DIAGNOSIS — E785 Hyperlipidemia, unspecified: Secondary | ICD-10-CM

## 2016-07-14 DIAGNOSIS — I25810 Atherosclerosis of coronary artery bypass graft(s) without angina pectoris: Secondary | ICD-10-CM

## 2016-07-14 DIAGNOSIS — E7849 Other hyperlipidemia: Secondary | ICD-10-CM | POA: Insufficient documentation

## 2016-07-14 DIAGNOSIS — Z006 Encounter for examination for normal comparison and control in clinical research program: Secondary | ICD-10-CM

## 2016-07-14 DIAGNOSIS — I2510 Atherosclerotic heart disease of native coronary artery without angina pectoris: Secondary | ICD-10-CM | POA: Insufficient documentation

## 2016-07-14 MED ORDER — ALIROCUMAB 75 MG/ML ~~LOC~~ SOPN: 1.0000 "pen " | PEN_INJECTOR | SUBCUTANEOUS | 11 refills | Status: DC

## 2016-07-14 NOTE — Progress Notes (Signed)
Subject met inclusion and exclusion criteria. The informed consent form, study requirements and expectations were reviewed with the subject and questions and concerns were addressed prior to the signing of the consent form. The subject verbalized understanding of the trail requirements. The subject agreed to participate in the [facility] Registryand signed the informed consent. The informed consent was obtained prior to performance of any protocol-specific procedures for the subject. A copy of the signed informed consent was given to the subject and a copy was placed in the subject's medical record.  Jake Bathe, RN 07/14/2016

## 2016-07-14 NOTE — Patient Instructions (Signed)
I will start paperwork for Repatha injections for your cholesterol.  Call Megan in lipid clinic with any questions [phone].

## 2016-07-14 NOTE — Telephone Encounter (Signed)
Received approval for Praluent injections through July 14 2017. Patient is aware, rx sent to [facility]. Pt will call once she receives first shipment so we can set up lab work.

## 2016-07-28 ENCOUNTER — Telehealth: Payer: Self-pay | Admitting: Pharmacist

## 2016-07-28 NOTE — Telephone Encounter (Signed)
Spoke with pt to follow up with Praluent shipment. Pt states that her monthly copay is $375 but that this is affordable. She is driving back from [place] today and set up first delivery of Praluent for tomorrow. Pt states she will call if she wants to give first injection in clinic. If she feels comfortable with injection at home, she will call to schedule lipid panel for February.

## 2016-08-01 ENCOUNTER — Telehealth: Payer: Self-pay | Admitting: Pharmacist

## 2016-08-01 DIAGNOSIS — E785 Hyperlipidemia, unspecified: Secondary | ICD-10-CM

## 2016-08-01 NOTE — Telephone Encounter (Signed)
Pt called to state her first shipment of Praluent is coming today. Scheduled lab work for mid February to assess response to Praluent therapy.

## 2016-09-12 ENCOUNTER — Other Ambulatory Visit: Payer: Medicare Other | Admitting: *Deleted

## 2016-09-12 DIAGNOSIS — E785 Hyperlipidemia, unspecified: Secondary | ICD-10-CM | POA: Diagnosis not present

## 2016-09-12 LAB — HEPATIC FUNCTION PANEL
ALBUMIN: 4.2 g/dL (ref 3.6–4.8)
ALT: 14 IU/L (ref 0–32)
AST: 16 IU/L (ref 0–40)
Alkaline Phosphatase: 69 IU/L (ref 39–117)
BILIRUBIN, DIRECT: 0.07 mg/dL (ref 0.00–0.40)
Bilirubin Total: 0.3 mg/dL (ref 0.0–1.2)
Total Protein: 6.6 g/dL (ref 6.0–8.5)

## 2016-09-12 LAB — LIPID PANEL
CHOLESTEROL TOTAL: 175 mg/dL (ref 100–199)
Chol/HDL Ratio: 3 ratio units (ref 0.0–4.4)
HDL: 59 mg/dL (ref >39)
LDL Calculated: 92 mg/dL (ref 0–99)
Triglycerides: 120 mg/dL (ref 0–149)
VLDL Cholesterol Cal: 24 mg/dL (ref 5–40)

## 2016-09-13 ENCOUNTER — Telehealth: Payer: Self-pay | Admitting: Pharmacist

## 2016-09-13 DIAGNOSIS — E785 Hyperlipidemia, unspecified: Secondary | ICD-10-CM

## 2016-09-13 NOTE — Telephone Encounter (Signed)
Followed up with pt regarding lipid panel results. LDL has improved from 237 to 92 since starting Praluent. Pt is tolerating injections well. Discussed goal LDL < 70 given CAD from elevated calcium score. She would like to continue taking the 75mg  Praluent dose for now since she is sensitive to medications. Will recheck lipids in 3 months.  Can consider increasing to 150mg  dose at that time. Labs scheduled.

## 2016-11-23 DIAGNOSIS — F419 Anxiety disorder, unspecified: Secondary | ICD-10-CM | POA: Diagnosis not present

## 2016-11-23 DIAGNOSIS — E7800 Pure hypercholesterolemia, unspecified: Secondary | ICD-10-CM | POA: Diagnosis not present

## 2016-11-23 DIAGNOSIS — I10 Essential (primary) hypertension: Secondary | ICD-10-CM | POA: Diagnosis not present

## 2016-11-23 DIAGNOSIS — R7301 Impaired fasting glucose: Secondary | ICD-10-CM | POA: Diagnosis not present

## 2016-11-23 DIAGNOSIS — E559 Vitamin D deficiency, unspecified: Secondary | ICD-10-CM | POA: Diagnosis not present

## 2016-11-23 DIAGNOSIS — N183 Chronic kidney disease, stage 3 (moderate): Secondary | ICD-10-CM | POA: Diagnosis not present

## 2016-11-24 ENCOUNTER — Other Ambulatory Visit: Payer: Self-pay | Admitting: Nurse Practitioner

## 2016-11-24 DIAGNOSIS — Z1231 Encounter for screening mammogram for malignant neoplasm of breast: Secondary | ICD-10-CM

## 2016-11-29 DIAGNOSIS — E7800 Pure hypercholesterolemia, unspecified: Secondary | ICD-10-CM | POA: Diagnosis not present

## 2016-11-29 DIAGNOSIS — E559 Vitamin D deficiency, unspecified: Secondary | ICD-10-CM | POA: Diagnosis not present

## 2016-11-29 DIAGNOSIS — R7301 Impaired fasting glucose: Secondary | ICD-10-CM | POA: Diagnosis not present

## 2016-11-29 DIAGNOSIS — I10 Essential (primary) hypertension: Secondary | ICD-10-CM | POA: Diagnosis not present

## 2016-11-29 DIAGNOSIS — N183 Chronic kidney disease, stage 3 (moderate): Secondary | ICD-10-CM | POA: Diagnosis not present

## 2016-12-06 ENCOUNTER — Other Ambulatory Visit: Payer: Medicare Other | Admitting: *Deleted

## 2016-12-06 DIAGNOSIS — E785 Hyperlipidemia, unspecified: Secondary | ICD-10-CM

## 2016-12-06 LAB — LIPID PANEL
Chol/HDL Ratio: 3.1 ratio (ref 0.0–4.4)
Cholesterol, Total: 164 mg/dL (ref 100–199)
HDL: 53 mg/dL (ref >39)
LDL Calculated: 79 mg/dL (ref 0–99)
Triglycerides: 161 mg/dL — ABNORMAL HIGH (ref 0–149)
VLDL Cholesterol Cal: 32 mg/dL (ref 5–40)

## 2016-12-27 ENCOUNTER — Encounter: Payer: Self-pay | Admitting: Cardiology

## 2017-01-09 ENCOUNTER — Ambulatory Visit
Admission: RE | Admit: 2017-01-09 | Discharge: 2017-01-09 | Disposition: A | Discharge status: Home / Self Care | Payer: Medicare Other | Source: Ambulatory Visit | Attending: Nurse Practitioner | Admitting: Nurse Practitioner

## 2017-01-09 DIAGNOSIS — Z1231 Encounter for screening mammogram for malignant neoplasm of breast: Secondary | ICD-10-CM | POA: Diagnosis not present

## 2017-01-16 ENCOUNTER — Ambulatory Visit: Payer: Medicare Other | Admitting: Cardiology

## 2017-01-19 ENCOUNTER — Encounter: Payer: Self-pay | Admitting: Cardiology

## 2017-01-19 ENCOUNTER — Ambulatory Visit (INDEPENDENT_AMBULATORY_CARE_PROVIDER_SITE_OTHER): Payer: Medicare Other | Admitting: Cardiology

## 2017-01-19 VITALS — BP 118/52 | HR 62 | Ht 67.0 in | Wt 182.8 lb

## 2017-01-19 DIAGNOSIS — E785 Hyperlipidemia, unspecified: Secondary | ICD-10-CM

## 2017-01-19 DIAGNOSIS — I2510 Atherosclerotic heart disease of native coronary artery without angina pectoris: Secondary | ICD-10-CM

## 2017-01-19 NOTE — Patient Instructions (Signed)
Medication Instructions:  Your physician recommends that you continue on your current medications as directed. Please refer to the Current Medication list given to you today.   Labwork: NONE ORDRED  Testing/Procedures: NONE ORDRED  Follow-Up: Your physician wants you to follow-up in: 6 MONTHS WITH DR. Marlou Porch You will receive a reminder letter in the mail two months in advance. If you don't receive a letter, please call our office to schedule the follow-up appointment.   Any Other Special Instructions Will Be Listed Below (If Applicable).     If you need a refill on your cardiac medications before your next appointment, please call your pharmacy.

## 2017-01-19 NOTE — Progress Notes (Signed)
Cardiology Office Note   Date:  01/19/2017   ID:  Evelyn, Heath 12/29/1945, MRN [id number]  PCP:  Evelyn Ruff, MD  Cardiologist:  Dr. Marlou Porch    Chief Complaint  Patient presents with  . Coronary Artery Disease    with calcium score on CT      History of Present Illness: Evelyn Heath is a 71 y.o. female who presents for hyperlipidemia  Other hx includes Lt carotid bruit, thyroid nodule She has no premature FH of CAD.  She has no hx of CAD herself.She is now followed in lipid clinic. She had cardiac CT for coronary ca+ score which was 128.  34 percentile of age and sex. With this she is now with CAD.    Also revealed subpleural nodule in LLL 4 mm.    She is placed on praluent injections.   Today she has no complaints no chest pain and no SOB.  She does not exercise much but has lost weight with [employer].  She is planning to add walking in child pool for exercise.  She is doing excellent with praluent with LDL of 79 and HDL of 53.        Her prior thyroid nodule biopsy was normal.  Good news.   Past Medical History:  Diagnosis Date  . Anxiety   . Borderline osteopenia   . Elevated cholesterol with elevated triglycerides   . History of shingles 10/07; 1/08; 2/08   per patient  . Hyperlipidemia   . Hypertension   . Insomnia, persistent   . Menopausal state 2000   no HRT  . Osteoporosis   . Vitamin deficiency     Past Surgical History:  Procedure Laterality Date  . ABDOMINAL EXPLORATION SURGERY  1981/ 1982   following ectopic pregnancy  . KNEE ARTHROSCOPY W/ ACL RECONSTRUCTION     left  . KNEE SURGERY     left     Current Outpatient Prescriptions  Medication Sig Dispense Refill  . Alirocumab (PRALUENT) 75 MG/ML SOPN Inject 1 pen into the skin every 14 (fourteen) days. 2 pen 11  . ALPRAZolam (XANAX) 0.25 MG tablet Take 0.25 mg by mouth at bedtime as needed for anxiety or sleep.     . B Complex Vitamins (VITAMIN B COMPLEX PO) TAKE 1 TABLET BY MOUTH EVERY  2-3 WEEKS    . cholecalciferol (VITAMIN D) 1000 UNITS tablet Take 2,000 Units by mouth daily.    Marland Kitchen FLUoxetine (PROZAC) 20 MG capsule Take 20 mg by mouth every morning.     Marland Kitchen levocetirizine (XYZAL) 5 MG tablet Take 1 tablet (5 mg total) by mouth every evening. 34 tablet 5  . Melatonin 1 MG CAPS Take by mouth at bedtime.    Marland Kitchen omeprazole (PRILOSEC) 20 MG capsule Take 20 mg by mouth 2 (two) times a week.     No current facility-administered medications for this visit.     Allergies:   Bactrim [sulfamethoxazole-trimethoprim]; Lipitor [atorvastatin]; Meloxicam; Statins; Terramycin [oxytetracycline]; Codeine; Crestor [rosuvastatin]; and Tetracyclines & related    Social History:  The patient  reports that she has quit smoking. Her smoking use included Cigarettes. She smoked 0.50 packs per day. She has never used smokeless tobacco. She reports that she drinks alcohol. She reports that she does not use drugs.   Family History:  The patient's family history includes Breast cancer in her maternal grandmother; Cancer in her maternal grandmother and mother; Cancer (age of onset: 65) in her father; Diabetes (age  of onset: 75) in her father.    ROS:  General:no colds or fevers, no weight changes Skin:no rashes or ulcers HEENT:no blurred vision, no congestion CV:see HPI PUL:see HPI GI:no diarrhea constipation or melena, no indigestion GU:no hematuria, no dysuria MS:no joint pain, no claudication Neuro:no syncope, no lightheadedness Endo:no diabetes, no thyroid disease  Wt Readings from Last 3 Encounters:  01/19/17 182 lb 12.8 oz (82.9 kg)  07/14/16 185 lb (83.9 kg)  06/30/16 180 lb 12.8 oz (82 kg)     PHYSICAL EXAM: VS:  BP (!) 118/52   Pulse 62   Ht 5\' 7"  (1.702 m)   Wt 182 lb 12.8 oz (82.9 kg)   LMP  (LMP Unknown)   BMI 28.63 kg/m  , BMI Body mass index is 28.63 kg/m. General:Pleasant affect, NAD Skin:Warm and dry, brisk capillary refill HEENT:normocephalic, sclera clear, mucus  membranes moist Neck:supple, no JVD, no bruits  Heart:S1S2 RRR without murmur, gallup, rub or click Lungs:clear without rales, rhonchi, or wheezes LTJ:QZES, non tender, + BS, do not palpate liver spleen or masses Ext:no lower ext edema, 2+ pedal pulses, 2+ radial pulses Neuro:alert and oriented, MAE, follows commands, + facial symmetry    EKG:  EKG is ordered today. The ekg ordered today demonstrates SR with no changes from previous.     Recent Labs: 09/12/2016: ALT 14    Lipid Panel    Component Value Date/Time   CHOL 164 12/06/2016 0741   TRIG 161 (H) 12/06/2016 0741   HDL 53 12/06/2016 0741   CHOLHDL 3.1 12/06/2016 0741   LDLCALC 79 12/06/2016 0741       Other studies Reviewed: Additional studies/ records that were reviewed today include: . Cardiac CT TECHNIQUE: The patient was scanned on a Siemens Somatom 64 slice scanner. Axial non-contrast 3 mm slices were carried out through the heart. The data set was analyzed on a dedicated work station and scored using the [place].  FINDINGS: Non-cardiac: See separate report from [facility].  Ascending Aorta:  Normal size.  Mild diffuse calcifications.  Pericardium: Normal.  Coronary arteries:  Normal origin.  IMPRESSION: Coronary calcium score of 128. This was 9 percentile for age and sex matched control.   ASSESSMENT AND PLAN:  1.  CAD no chst pain and nO sob  2. HLD much improved with PCSK9   Follow up with Dr.Skains in 6 months.    Current medicines are reviewed with the patient today.  The patient Has no concerns regarding medicines.  The following changes have been made:  See above Labs/ tests ordered today include:see above  Disposition:   FU:  see above  Signed, Cecilie Kicks, NP  01/19/2017 5:33 PM    [facility] [place], [place], [place], [place] Phone: [phone]; Fax: [phone]

## 2017-02-10 ENCOUNTER — Other Ambulatory Visit: Payer: Self-pay

## 2017-02-10 DIAGNOSIS — L309 Dermatitis, unspecified: Secondary | ICD-10-CM

## 2017-02-10 MED ORDER — LEVOCETIRIZINE DIHYDROCHLORIDE 5 MG PO TABS: 5.0000 mg | ORAL_TABLET | Freq: Every evening | ORAL | 5 refills | Status: DC

## 2017-03-08 DIAGNOSIS — B351 Tinea unguium: Secondary | ICD-10-CM | POA: Diagnosis not present

## 2017-03-08 DIAGNOSIS — S90211A Contusion of right great toe with damage to nail, initial encounter: Secondary | ICD-10-CM | POA: Diagnosis not present

## 2017-05-29 DIAGNOSIS — N183 Chronic kidney disease, stage 3 (moderate): Secondary | ICD-10-CM | POA: Diagnosis not present

## 2017-05-29 DIAGNOSIS — E041 Nontoxic single thyroid nodule: Secondary | ICD-10-CM | POA: Diagnosis not present

## 2017-05-29 DIAGNOSIS — E559 Vitamin D deficiency, unspecified: Secondary | ICD-10-CM | POA: Diagnosis not present

## 2017-05-29 DIAGNOSIS — R7301 Impaired fasting glucose: Secondary | ICD-10-CM | POA: Diagnosis not present

## 2017-05-29 DIAGNOSIS — E7800 Pure hypercholesterolemia, unspecified: Secondary | ICD-10-CM | POA: Diagnosis not present

## 2017-06-02 DIAGNOSIS — N183 Chronic kidney disease, stage 3 (moderate): Secondary | ICD-10-CM | POA: Diagnosis not present

## 2017-06-02 DIAGNOSIS — E7800 Pure hypercholesterolemia, unspecified: Secondary | ICD-10-CM | POA: Diagnosis not present

## 2017-06-02 DIAGNOSIS — F339 Major depressive disorder, recurrent, unspecified: Secondary | ICD-10-CM | POA: Diagnosis not present

## 2017-06-02 DIAGNOSIS — R7301 Impaired fasting glucose: Secondary | ICD-10-CM | POA: Diagnosis not present

## 2017-06-02 DIAGNOSIS — Z23 Encounter for immunization: Secondary | ICD-10-CM | POA: Diagnosis not present

## 2017-06-02 DIAGNOSIS — F419 Anxiety disorder, unspecified: Secondary | ICD-10-CM | POA: Diagnosis not present

## 2017-06-02 DIAGNOSIS — E559 Vitamin D deficiency, unspecified: Secondary | ICD-10-CM | POA: Diagnosis not present

## 2017-06-02 DIAGNOSIS — I10 Essential (primary) hypertension: Secondary | ICD-10-CM | POA: Diagnosis not present

## 2017-06-26 ENCOUNTER — Other Ambulatory Visit: Payer: Self-pay | Admitting: Cardiology

## 2017-07-06 ENCOUNTER — Ambulatory Visit (INDEPENDENT_AMBULATORY_CARE_PROVIDER_SITE_OTHER): Payer: Medicare Other | Admitting: Cardiology

## 2017-07-06 ENCOUNTER — Encounter: Payer: Self-pay | Admitting: Cardiology

## 2017-07-06 VITALS — BP 150/70 | HR 70 | Ht 67.0 in | Wt 192.8 lb

## 2017-07-06 DIAGNOSIS — E785 Hyperlipidemia, unspecified: Secondary | ICD-10-CM | POA: Diagnosis not present

## 2017-07-06 DIAGNOSIS — E7849 Other hyperlipidemia: Secondary | ICD-10-CM

## 2017-07-06 DIAGNOSIS — I2510 Atherosclerotic heart disease of native coronary artery without angina pectoris: Secondary | ICD-10-CM | POA: Diagnosis not present

## 2017-07-06 NOTE — Progress Notes (Signed)
Cardiology Office Note    Date:  07/06/2017   ID:  Evelyn, Heath 10-15-1945, MRN [id number]  PCP:  Evelyn Ruff, MD  Cardiologist:   Evelyn Furbish, MD     History of Present Illness:  Evelyn Heath is a 71 y.o. female here for evaluation of difficult to control lipids with prior statin intolerance currently doing well on Praluent injections 75 mg twice a month.  CAD noted on CT scan coronary calcification, see below for details.  Previous: LDL 237, non-HDL 266, HDL 66, triglycerides 145, total cholesterol 332 on 05/25/16 performed at [facility] with Dr. Drema Heath. Unable to tolerate statins, Zetia, WelChol. She is working with [employer] and plans to start [employer]. Anxiety, Prozac. Retired [employer]. Hemoglobin A1c 5.9. Creatinine 1.1, vitamin D 37, normal. ALT 15.  Lipitor - left 4-5 digits woke up numb Crestor - cramps Welchol - numbness in legs Zetia - leg numbness.   No early FHX of CAD. Paternal grandfather died CHF. Brother and 2 sisters with no cardiac issues.   No diabetes, no current smoking, quit in 2013-just stopped.  Tried statins first in the 1990's.  07/06/17-overall she is doing well.  Tolerating injections well, probably Lint 75 twice a month.  She did notice at last check lipids with Dr. Drema Heath her LDL went up to 124 from 79.  Nothing else has changed.  No chest pain syncope bleeding orthopnea PND  Past Medical History:  Diagnosis Date  . Anxiety   . Borderline osteopenia   . Elevated cholesterol with elevated triglycerides   . History of shingles 10/07; 1/08; 2/08   per patient  . Hyperlipidemia   . Hypertension   . Insomnia, persistent   . Menopausal state 2000   no HRT  . Osteoporosis   . Vitamin deficiency     Past Surgical History:  Procedure Laterality Date  . ABDOMINAL EXPLORATION SURGERY  1981/ 1982   following ectopic pregnancy  . KNEE ARTHROSCOPY W/ ACL RECONSTRUCTION     left  . KNEE SURGERY     left    Current  Medications: Outpatient Medications Prior to Visit  Medication Sig Dispense Refill  . ALPRAZolam (XANAX) 0.25 MG tablet Take 0.25 mg by mouth at bedtime as needed for anxiety or sleep.     . B Complex Vitamins (VITAMIN B COMPLEX PO) TAKE 1 TABLET BY MOUTH EVERY 2-3 WEEKS    . cholecalciferol (VITAMIN D) 1000 UNITS tablet Take 2,000 Units by mouth daily.    Marland Kitchen FLUoxetine (PROZAC) 20 MG capsule Take 20 mg by mouth every morning.     Marland Kitchen levocetirizine (XYZAL) 5 MG tablet Take 1 tablet (5 mg total) by mouth every evening. 34 tablet 5  . Melatonin 1 MG CAPS Take by mouth at bedtime.    Marland Kitchen omeprazole (PRILOSEC) 20 MG capsule Take 20 mg by mouth 2 (two) times a week.    Marland Kitchen PRALUENT 75 MG/ML SOPN INJECT 1 PEN INTO THE SKIN EVERY FOURTEEN DAYS 6 pen 3   No facility-administered medications prior to visit.      Allergies:   Bactrim [sulfamethoxazole-trimethoprim]; Lipitor [atorvastatin]; Meloxicam; Statins; Terramycin [oxytetracycline]; Codeine; Crestor [rosuvastatin]; and Tetracyclines & related   Social History   Socioeconomic History  . Marital status: Married    Spouse name: Evelyn Heath  . Number of children: 1  . Years of education: None  . Highest education level: None  Social Needs  . Financial resource strain: None  . Food insecurity -  worry: None  . Food insecurity - inability: None  . Transportation needs - medical: None  . Transportation needs - non-medical: None  Occupational History  . Occupation: Charity fundraiser for [employer]  . Occupation: retired  Tobacco Use  . Smoking status: Former Smoker    Packs/day: 0.50    Types: Cigarettes  . Smokeless tobacco: Never Used  Substance and Sexual Activity  . Alcohol use: Yes    Comment: rare wine  . Drug use: No  . Sexual activity: Yes    Partners: Male    Birth control/protection: Post-menopausal  Other Topics Concern  . None  Social History Narrative  . None     Family History:  The patient's family history  includes Breast cancer in her maternal grandmother; Cancer in her maternal grandmother and mother; Cancer (age of onset: 49) in her father; Diabetes (age of onset: 59) in her father.   ROS:   Please see the history of present illness.    Review of Systems  All other systems reviewed and are negative.     PHYSICAL EXAM:   VS:  BP (!) 150/70   Pulse 70   Ht 5\' 7"  (1.702 m)   Wt 192 lb 12.8 oz (87.5 kg)   LMP  (LMP Unknown)   SpO2 96%   BMI 30.20 kg/m    GEN: Well nourished, well developed, in no acute distress  HEENT: normal  Neck: no JVD, + left carotid bruit, or masses Cardiac: RRR; no murmurs, rubs, or gallops,no edema  Respiratory:  clear to auscultation bilaterally, normal work of breathing GI: soft, nontender, nondistended, + BS MS: no deformity or atrophy  Skin: warm and dry, no rash Neuro:  Alert and Oriented x 3, Strength and sensation are intact Psych: euthymic mood, full affect   Wt Readings from Last 3 Encounters:  07/06/17 192 lb 12.8 oz (87.5 kg)  01/19/17 182 lb 12.8 oz (82.9 kg)  07/14/16 185 lb (83.9 kg)      Studies/Labs Reviewed:   EKG:  EKG is ordered today.  The ekg ordered today demonstrates 06/30/16-sinus rhythm, 63, no other abnormalities. Personally viewed.  Recent Labs: 09/12/2016: ALT 14   Lipid Panel    Component Value Date/Time   CHOL 164 12/06/2016 0741   TRIG 161 (H) 12/06/2016 0741   HDL 53 12/06/2016 0741   CHOLHDL 3.1 12/06/2016 0741   LDLCALC 79 12/06/2016 0741    Additional studies/ records that were reviewed today include:  Office notes from Dr. Drema Heath reviewed, lab work reviewed, EKG reviewed    ASSESSMENT:    1. Hyperlipidemia LDL goal <70   2. Atherosclerosis of native coronary artery of native heart without angina pectoris   3. Familial hyperlipidemia      PLAN:  In order of problems listed above:  Familial hyperlipidemia/statin intolerance  -Currently on Praluent injections, appreciate lipid clinic.  Calcium  score 77th percentile, previously 128.  CAD.  - LDL cholesterol of 230 range at baseline, now 124.  Originally after starting however her LDL was 79.  HDL 66.  I will have her recheck this in 3 months and sit down with the lipid clinic to make sure that all is well.  She is tolerating the injections just fine.  No side effects.  Twice a month.  75 mg.  - Has had statin intolerance. Has tried several.  - She has 2 sisters, one brother, and both parents none of which have had cardiovascular illness. She does  not believe that her siblings have hyperlipidemia however.  Left carotid bruit  - Per patient, vascular studies normal without any carotid stenosis.  No changes.  Still on exam.  Thyroid nodule  - biopsy, this is fine.   Medication Adjustments/Labs and Tests Ordered: Current medicines are reviewed at length with the patient today.  Concerns regarding medicines are outlined above.  Medication changes, Labs and Tests ordered today are listed in the Patient Instructions below. Patient Instructions  Medication Instructions:  Your physician recommends that you continue on your current medications as directed. Please refer to the Current Medication list given to you today.  Labwork: Your physician recommends that you return for lab work in: 3 months (Lipid.  Needs to be prior to Lipid Clinic appt)   Testing/Procedures: None  Follow-Up: Your physician recommends that you schedule a follow-up appointment in: 3 months with Lipid Clinic (Needs Lipid drawn prior to appt)  Your physician wants you to follow-up in: 1 year with Dr. Marlou Porch.  You will receive a reminder letter in the mail two months in advance. If you don't receive a letter, please call our office to schedule the follow-up appointment.   Any Other Special Instructions Will Be Listed Below (If Applicable).     If you need a refill on your cardiac medications before your next appointment, please call your pharmacy.       Signed, Evelyn Furbish, MD  07/06/2017 10:44 AM    [facility] [place], [place], [place]  [address] Phone: [phone]; Fax: [phone]

## 2017-07-06 NOTE — Patient Instructions (Signed)
Medication Instructions:  Your physician recommends that you continue on your current medications as directed. Please refer to the Current Medication list given to you today.  Labwork: Your physician recommends that you return for lab work in: 3 months (Lipid.  Needs to be prior to Lipid Clinic appt)   Testing/Procedures: None  Follow-Up: Your physician recommends that you schedule a follow-up appointment in: 3 months with Lipid Clinic (Needs Lipid drawn prior to appt)  Your physician wants you to follow-up in: 1 year with Dr. Marlou Porch.  You will receive a reminder letter in the mail two months in advance. If you don't receive a letter, please call our office to schedule the follow-up appointment.   Any Other Special Instructions Will Be Listed Below (If Applicable).     If you need a refill on your cardiac medications before your next appointment, please call your pharmacy.

## 2017-09-04 ENCOUNTER — Other Ambulatory Visit: Payer: Self-pay | Admitting: Family Medicine

## 2017-09-04 DIAGNOSIS — E041 Nontoxic single thyroid nodule: Secondary | ICD-10-CM

## 2017-09-05 ENCOUNTER — Other Ambulatory Visit: Payer: Self-pay

## 2017-09-12 ENCOUNTER — Ambulatory Visit
Admission: RE | Admit: 2017-09-12 | Discharge: 2017-09-12 | Disposition: A | Discharge status: Home / Self Care | Payer: Medicare Other | Source: Ambulatory Visit | Attending: Family Medicine | Admitting: Family Medicine

## 2017-09-12 DIAGNOSIS — E041 Nontoxic single thyroid nodule: Secondary | ICD-10-CM

## 2017-09-12 DIAGNOSIS — E042 Nontoxic multinodular goiter: Secondary | ICD-10-CM | POA: Diagnosis not present

## 2017-10-12 ENCOUNTER — Other Ambulatory Visit: Payer: Medicare Other | Admitting: *Deleted

## 2017-10-12 ENCOUNTER — Encounter (INDEPENDENT_AMBULATORY_CARE_PROVIDER_SITE_OTHER): Payer: Self-pay

## 2017-10-12 DIAGNOSIS — E785 Hyperlipidemia, unspecified: Secondary | ICD-10-CM | POA: Diagnosis not present

## 2017-10-12 LAB — LIPID PANEL
Chol/HDL Ratio: 3.1 ratio (ref 0.0–4.4)
Cholesterol, Total: 176 mg/dL (ref 100–199)
HDL: 57 mg/dL (ref >39)
LDL Calculated: 99 mg/dL (ref 0–99)
Triglycerides: 102 mg/dL (ref 0–149)
VLDL CHOLESTEROL CAL: 20 mg/dL (ref 5–40)

## 2017-10-18 NOTE — Progress Notes (Signed)
Patient ID: Evelyn Heath                 DOB: 15-Dec-1945                    MRN: [id number]     HPI: Evelyn Heath is a 72 y.o. female patient referred to lipid clinic by Dr Marlou Porch who presents today for follow up. PMH is significant for HLD, HTN, anxiety, and osteoporosis. Bilateral carotid ultrasound from July 2007 showed mild bilateral carotid atherosclerosis, calcium score from 06/2016 showed CAD - score of 128 which was 77th percentile. Pt has a history of statin intolerance and baseline LDL > 200. She was started on Praluent injections in 06/2016 and presents today for follow up. Most recent LDL checked last week was 99.  Patient has previously tried Crestor and Lipitor. She experienced hand numbness when she took Lipitor for 1 year. She immediately stopped taking Lipitor and the numbness resolved after 2-3 days. She has tried Crestor and lovastatin as well but experienced joint and muscle aches with these. Zetia and Welchol both caused muscle aches too. She has been tolerating her Praluent well. Have previously discussed increasing Praluent dose from 75mg  to 150mg  to target LDL < 70, however pt remains hesitant to do so since she is tolerating her current dose well and has had issues tolerating higher doses of cholesterol medication in the past.  Current Medications: Praluent 75mg /mL Q2W Intolerances: Lipitor - hand numbness, Crestor 20mg  daily (2011) - myalgias, lovastatin 20mg  daily (2014) - myalgias, Zetia 10mg  daily and Welchol 1,875mg  BID - myalgias  Risk Factors: LDL > 200, mild bilateral carotid atherosclerosis, calcium score 77th percentile LDL goal: 70mg /dL due to CAD and calcium score 77th percentile  Diet: Does not eat fried foods. Eating more vegetables. Limiting herself to 1200 calories a day using a calorie counter app. Low calorie ice cream as a snack.  Exercise: Getting back to swimming twice a week, gardening, and walking.  Family History: No early family history  of CAD. Paternal grandfather died of CHF. Brother and 2 sisters with no cardiac issues.  Social History: Retired Clinical biochemist. Quit smoking.  Labs: 10/12/17: TC 176, TG 102, HDL 57, LDL 99 (Praluent 75mg /mL Q2W) 05/25/16: TC 332, HDL 66, TG 145, LDL 237, non-HDL 266 at [place] [facility] [address] with Dr Drema [place]  Past Medical History:  Diagnosis Date  . Anxiety   . Borderline osteopenia   . Elevated cholesterol with elevated triglycerides   . History of shingles 10/07; 1/08; 2/08   per patient  . Hyperlipidemia   . Hypertension   . Insomnia, persistent   . Menopausal state 2000   no HRT  . Osteoporosis   . Vitamin deficiency     Current Outpatient Medications on File Prior to Visit  Medication Sig Dispense Refill  . ALPRAZolam (XANAX) 0.25 MG tablet Take 0.25 mg by mouth at bedtime as needed for anxiety or sleep.     . B Complex Vitamins (VITAMIN B COMPLEX PO) TAKE 1 TABLET BY MOUTH EVERY 2-3 WEEKS    . cholecalciferol (VITAMIN D) 1000 UNITS tablet Take 2,000 Units by mouth daily.    Marland Kitchen FLUoxetine (PROZAC) 20 MG capsule Take 20 mg by mouth every morning.     Marland Kitchen levocetirizine (XYZAL) 5 MG tablet Take 1 tablet (5 mg total) by mouth every evening. 34 tablet 5  . Melatonin 1 MG CAPS Take by mouth at bedtime.    Marland Kitchen omeprazole (  PRILOSEC) 20 MG capsule Take 20 mg by mouth 2 (two) times a week.    Marland Kitchen PRALUENT 75 MG/ML SOPN INJECT 1 PEN INTO THE SKIN EVERY FOURTEEN DAYS 6 pen 3   No current facility-administered medications on file prior to visit.     Allergies  Allergen Reactions  . Bactrim [Sulfamethoxazole-Trimethoprim] Nausea Only  . Lipitor [Atorvastatin]     Numbness in fingers  . Meloxicam     Upset stomach  . Statins     numbness  . Terramycin [Oxytetracycline]   . Codeine Rash  . Crestor [Rosuvastatin]   . Tetracyclines & Related Rash    Rash in mouth    Assessment/Plan:  1. Hyperlipidemia - LDL has improved from baseline of 237 to 99 most recently on Praluent 75mg /mL Q2W.  Pt has been tolerating PCSK9i therapy well for the past few years. Discussed increasing Praluent dose to 150 due to history of cAD, however pt remains hesitant to do so. She wishes to focus on increasing her exercise at the [facility] pool. Will continue Praluent 75mg /mL and encouraged pt to increase exercise and continue with healthy diet. F/u as needed in lipid clinic or if pt wishes to increase her Praluent dose.   Miri Jose E. Euell Schiff, PharmD, CPP, [place] [address] [place], [place] [address] Phone: [phone]; Fax: [phone] 10/18/2017 3:32 PM

## 2017-10-19 ENCOUNTER — Ambulatory Visit (INDEPENDENT_AMBULATORY_CARE_PROVIDER_SITE_OTHER): Payer: Medicare Other | Admitting: Pharmacist

## 2017-10-19 DIAGNOSIS — E785 Hyperlipidemia, unspecified: Secondary | ICD-10-CM | POA: Diagnosis not present

## 2017-10-19 MED ORDER — ALIROCUMAB 75 MG/ML ~~LOC~~ SOPN: 1.0000 "pen " | PEN_INJECTOR | SUBCUTANEOUS | 3 refills | Status: DC

## 2017-10-27 DIAGNOSIS — J018 Other acute sinusitis: Secondary | ICD-10-CM | POA: Diagnosis not present

## 2017-10-27 DIAGNOSIS — R51 Headache: Secondary | ICD-10-CM | POA: Diagnosis not present

## 2017-10-27 DIAGNOSIS — R0602 Shortness of breath: Secondary | ICD-10-CM | POA: Diagnosis not present

## 2017-11-09 ENCOUNTER — Other Ambulatory Visit: Payer: Self-pay

## 2017-11-09 DIAGNOSIS — L309 Dermatitis, unspecified: Secondary | ICD-10-CM

## 2017-11-09 MED ORDER — LEVOCETIRIZINE DIHYDROCHLORIDE 5 MG PO TABS
5.0000 mg | ORAL_TABLET | Freq: Every evening | ORAL | 5 refills | Status: DC
Start: 2017-11-09 — End: 2019-07-18

## 2018-01-29 DIAGNOSIS — F419 Anxiety disorder, unspecified: Secondary | ICD-10-CM | POA: Diagnosis not present

## 2018-01-29 DIAGNOSIS — F339 Major depressive disorder, recurrent, unspecified: Secondary | ICD-10-CM | POA: Diagnosis not present

## 2018-01-29 DIAGNOSIS — E559 Vitamin D deficiency, unspecified: Secondary | ICD-10-CM | POA: Diagnosis not present

## 2018-01-29 DIAGNOSIS — G479 Sleep disorder, unspecified: Secondary | ICD-10-CM | POA: Diagnosis not present

## 2018-01-29 DIAGNOSIS — E041 Nontoxic single thyroid nodule: Secondary | ICD-10-CM | POA: Diagnosis not present

## 2018-01-29 DIAGNOSIS — L989 Disorder of the skin and subcutaneous tissue, unspecified: Secondary | ICD-10-CM | POA: Diagnosis not present

## 2018-01-29 DIAGNOSIS — N183 Chronic kidney disease, stage 3 (moderate): Secondary | ICD-10-CM | POA: Diagnosis not present

## 2018-01-29 DIAGNOSIS — E7800 Pure hypercholesterolemia, unspecified: Secondary | ICD-10-CM | POA: Diagnosis not present

## 2018-01-29 DIAGNOSIS — R7301 Impaired fasting glucose: Secondary | ICD-10-CM | POA: Diagnosis not present

## 2018-01-29 DIAGNOSIS — I10 Essential (primary) hypertension: Secondary | ICD-10-CM | POA: Diagnosis not present

## 2018-02-01 ENCOUNTER — Other Ambulatory Visit: Payer: Self-pay | Admitting: Family Medicine

## 2018-02-01 DIAGNOSIS — Z1231 Encounter for screening mammogram for malignant neoplasm of breast: Secondary | ICD-10-CM

## 2018-02-27 ENCOUNTER — Ambulatory Visit
Admission: RE | Admit: 2018-02-27 | Discharge: 2018-02-27 | Disposition: A | Discharge status: Home / Self Care | Payer: Medicare Other | Source: Ambulatory Visit | Attending: Family Medicine | Admitting: Family Medicine

## 2018-02-27 DIAGNOSIS — Z1231 Encounter for screening mammogram for malignant neoplasm of breast: Secondary | ICD-10-CM | POA: Diagnosis not present

## 2018-03-09 DIAGNOSIS — D225 Melanocytic nevi of trunk: Secondary | ICD-10-CM | POA: Diagnosis not present

## 2018-03-09 DIAGNOSIS — L821 Other seborrheic keratosis: Secondary | ICD-10-CM | POA: Diagnosis not present

## 2018-05-29 ENCOUNTER — Other Ambulatory Visit: Payer: Self-pay

## 2018-05-29 DIAGNOSIS — L309 Dermatitis, unspecified: Secondary | ICD-10-CM

## 2018-06-04 ENCOUNTER — Other Ambulatory Visit: Payer: Self-pay | Admitting: Allergy

## 2018-06-04 DIAGNOSIS — L309 Dermatitis, unspecified: Secondary | ICD-10-CM

## 2018-07-09 ENCOUNTER — Encounter: Payer: Self-pay | Admitting: Cardiology

## 2018-07-09 ENCOUNTER — Ambulatory Visit (INDEPENDENT_AMBULATORY_CARE_PROVIDER_SITE_OTHER): Payer: Medicare Other | Admitting: Cardiology

## 2018-07-09 VITALS — BP 142/78 | HR 64 | Ht 67.0 in | Wt 198.8 lb

## 2018-07-09 DIAGNOSIS — R0989 Other specified symptoms and signs involving the circulatory and respiratory systems: Secondary | ICD-10-CM | POA: Diagnosis not present

## 2018-07-09 DIAGNOSIS — E7849 Other hyperlipidemia: Secondary | ICD-10-CM

## 2018-07-09 NOTE — Progress Notes (Signed)
Cardiology Office Note:    Date:  07/09/2018   ID:  CORDELL COKE, DOB 07/03/1946, MRN [id number]  PCP:  Leighton Ruff, MD  Cardiologist:  No primary care provider on file.  Electrophysiologist:  None   Referring MD: Leighton Ruff, MD     History of Present Illness:    Evelyn Heath is a 72 y.o. female here for follow-up of hyperlipidemia.  Praluent injections.  CAD noted on CT scan coronary calcification, see below for details.  Previous: LDL 237, non-HDL 266, HDL 66, triglycerides 145, total cholesterol 332 on 05/25/16 performed at [facility] with Dr. Drema Dallas. Unable to tolerate statins, Zetia, WelChol. She is working with [employer] and plans to start Nutrisystem. Anxiety, Prozac. Retired [employer]. Hemoglobin A1c 5.9. Creatinine 1.1, vitamin D 37, normal. ALT 15.  Lipitor - left 4-5 digits woke up numb Crestor - cramps Welchol - numbness in legs Zetia - leg numbness.   No early FHX of CAD. Paternal grandfather died CHF. Brother and 2 sisters with no cardiac issues.   No diabetes, no current smoking, quit in 2013-just stopped.  Tried statins first in the 1990's.  She remained hesitant to increase Praluent dose from 75 mg to 150 mg to target LDL less than 70.  Overall LDL from 237-99.  She states that she is having some leg discomfort but she also has knee osteoarthritis as well.  Hard for her to determine the difference but she would like to talk with Megan and lipid clinic about this.  Past Medical History:  Diagnosis Date  . Anxiety   . Borderline osteopenia   . Elevated cholesterol with elevated triglycerides   . History of shingles 10/07; 1/08; 2/08   per patient  . Hyperlipidemia   . Hypertension   . Insomnia, persistent   . Menopausal state 2000   no HRT  . Osteoporosis   . Vitamin deficiency     Past Surgical History:  Procedure Laterality Date  . ABDOMINAL EXPLORATION SURGERY  1981/ 1982   following ectopic pregnancy  . KNEE  ARTHROSCOPY W/ ACL RECONSTRUCTION     left  . KNEE SURGERY     left    Current Medications: Current Meds  Medication Sig  . Alirocumab (PRALUENT) 75 MG/ML SOPN Inject 1 pen into the skin every 14 (fourteen) days.  . ALPRAZolam (XANAX) 0.25 MG tablet Take 0.25 mg by mouth at bedtime as needed for anxiety or sleep.   . B Complex Vitamins (VITAMIN B COMPLEX PO) TAKE 1 TABLET BY MOUTH EVERY 2-3 WEEKS  . cholecalciferol (VITAMIN D) 1000 UNITS tablet Take 2,000 Units by mouth daily.  Marland Kitchen FLUoxetine (PROZAC) 20 MG capsule Take 20 mg by mouth every morning.   Marland Kitchen levocetirizine (XYZAL) 5 MG tablet Take 1 tablet (5 mg total) by mouth every evening.  . Melatonin 1 MG CAPS Take 3 mg by mouth at bedtime.   Marland Kitchen omeprazole (PRILOSEC) 20 MG capsule Take 20 mg by mouth 2 (two) times a week.     Allergies:   Bactrim [sulfamethoxazole-trimethoprim]; Lipitor [atorvastatin]; Meloxicam; Statins; Terramycin [oxytetracycline]; Codeine; Crestor [rosuvastatin]; and Tetracyclines & related   Social History   Socioeconomic History  . Marital status: Married    Spouse name: Kenika Sahm  . Number of children: 1  . Years of education: Not on file  . Highest education level: Not on file  Occupational History  . Occupation: Charity fundraiser for [employer]  . Occupation: retired  Scientific laboratory technician  .  Financial resource strain: Not on file  . Food insecurity:    Worry: Not on file    Inability: Not on file  . Transportation needs:    Medical: Not on file    Non-medical: Not on file  Tobacco Use  . Smoking status: Former Smoker    Packs/day: 0.50    Types: Cigarettes  . Smokeless tobacco: Never Used  Substance and Sexual Activity  . Alcohol use: Yes    Comment: rare wine  . Drug use: No  . Sexual activity: Yes    Partners: Male    Birth control/protection: Post-menopausal  Lifestyle  . Physical activity:    Days per week: Not on file    Minutes per session: Not on file  . Stress: Not on file    Relationships  . Social connections:    Talks on phone: Not on file    Gets together: Not on file    Attends religious service: Not on file    Active member of club or organization: Not on file    Attends meetings of clubs or organizations: Not on file    Relationship status: Not on file  Other Topics Concern  . Not on file  Social History Narrative  . Not on file     Family History: The patient's family history includes Breast cancer in her maternal grandmother; Cancer in her maternal grandmother and mother; Cancer (age of onset: 19) in her father; Diabetes (age of onset: 37) in her father. There is no history of Angioedema, Allergic rhinitis, Asthma, Eczema, Immunodeficiency, or Urticaria.  ROS:   Please see the history of present illness.    Denies any fevers chills nausea vomiting syncope bleeding all other systems reviewed and are negative.  EKGs/Labs/Other Studies Reviewed:    The following studies were reviewed today: Prior office notes lab work LDL EKG  EKG:  EKG is  ordered today.  The ekg ordered today demonstrates 07/09/2018-sinus rhythm no other abnormalities personally reviewed and interpreted.  Recent Labs: No results found for requested labs within last 8760 hours.  Recent Lipid Panel    Component Value Date/Time   CHOL 176 10/12/2017 0744   TRIG 102 10/12/2017 0744   HDL 57 10/12/2017 0744   CHOLHDL 3.1 10/12/2017 0744   LDLCALC 99 10/12/2017 0744    Physical Exam:    VS:  BP (!) 142/78   Pulse 64   Ht 5\' 7"  (1.702 m)   Wt 198 lb 12.8 oz (90.2 kg)   LMP  (LMP Unknown)   SpO2 97%   BMI 31.14 kg/m     Wt Readings from Last 3 Encounters:  07/09/18 198 lb 12.8 oz (90.2 kg)  07/06/17 192 lb 12.8 oz (87.5 kg)  01/19/17 182 lb 12.8 oz (82.9 kg)     GEN:  Well nourished, well developed in no acute distress HEENT: Normal NECK: No JVD; Left carotid bruit LYMPHATICS: No lymphadenopathy CARDIAC: RRR, no murmurs, rubs, gallops RESPIRATORY:  Clear to  auscultation without rales, wheezing or rhonchi  ABDOMEN: Soft, non-tender, non-distended MUSCULOSKELETAL:  No edema; No deformity  SKIN: Warm and dry NEUROLOGIC:  Alert and oriented x 3 PSYCHIATRIC:  Normal affect   ASSESSMENT:    1. Familial hyperlipidemia   2. Left carotid bruit    PLAN:    In order of problems listed above:  Hyperlipidemia likely familial -Baseline LDL was in the 237 range.  Praluent 75 mg every 2 weeks has reduced this to 99.  Continue  to exercise, [employer].  Appreciate help from Megan supple and lipid clinic.  She is a little bit worried about her leg discomfort.  Likely secondary to osteoarthritis however.  I will have her talk once again with Megan in about 2 months for reassurance.  Left-sided carotid bruit -Carotid Doppler ordered.  This was also heard by Dr. Drema Dallas.  Expressed the importance of good lipid control.  Trying to avoid stroke.  Coronary artery calcium -Noted on prior CT.  Continue with aggressive risk factor modification.  Statin intolerance - Currently on Praluent 75.  Medication Adjustments/Labs and Tests Ordered: Current medicines are reviewed at length with the patient today.  Concerns regarding medicines are outlined above.  Orders Placed This Encounter  Procedures  . EKG 12-Lead   No orders of the defined types were placed in this encounter.   Patient Instructions  Medication Instructions:  Your physician recommends that you continue on your current medications as directed. Please refer to the Current Medication list given to you today.  If you need a refill on your cardiac medications before your next appointment, please call your pharmacy.   Lab work: None If you have labs (blood work) drawn today and your tests are completely normal, you will receive your results only by: Marland Kitchen MyChart Message (if you have MyChart) OR . A paper copy in the mail If you have any lab test that is abnormal or we need to change your treatment, we  will call you to review the results.  Testing/Procedures: Your physician has requested that you have a carotid duplex. This test is an ultrasound of the carotid arteries in your neck. It looks at blood flow through these arteries that supply the brain with blood. Allow one hour for this exam. There are no restrictions or special instructions.   Follow-Up: Your physician recommends that you schedule a follow-up appointment in: 2 months with Jinny Blossom, PharmD in the [facility].  At [facility], you and your health needs are our priority.  As part of our continuing mission to provide you with exceptional heart care, we have created designated Provider Care Teams.  These Care Teams include your primary Cardiologist (physician) and Advanced Practice Providers (APPs -  Physician Assistants and Nurse Practitioners) who all work together to provide you with the care you need, when you need it. You will need a follow up appointment in 12 months.  Please call our office 2 months in advance to schedule this appointment.  You may see Dr. Marlou Porch or one of the following Advanced Practice Providers on your designated Care Team:   Truitt Merle, NP Cecilie Kicks, NP . Kathyrn Drown, NP  Any Other Special Instructions Will Be Listed Below (If Applicable).       Signed, Candee Furbish, MD  07/09/2018 9:15 AM    [facility]

## 2018-07-09 NOTE — Patient Instructions (Addendum)
Medication Instructions:  Your physician recommends that you continue on your current medications as directed. Please refer to the Current Medication list given to you today.  If you need a refill on your cardiac medications before your next appointment, please call your pharmacy.   Lab work: None If you have labs (blood work) drawn today and your tests are completely normal, you will receive your results only by: Marland Kitchen MyChart Message (if you have MyChart) OR . A paper copy in the mail If you have any lab test that is abnormal or we need to change your treatment, we will call you to review the results.  Testing/Procedures: Your physician has requested that you have a carotid duplex. This test is an ultrasound of the carotid arteries in your neck. It looks at blood flow through these arteries that supply the brain with blood. Allow one hour for this exam. There are no restrictions or special instructions.   Follow-Up: Your physician recommends that you schedule a follow-up appointment in: 2 months with Jinny Blossom, PharmD in the [facility].  At [facility], you and your health needs are our priority.  As part of our continuing mission to provide you with exceptional heart care, we have created designated Provider Care Teams.  These Care Teams include your primary Cardiologist (physician) and Advanced Practice Providers (APPs -  Physician Assistants and Nurse Practitioners) who all work together to provide you with the care you need, when you need it. You will need a follow up appointment in 12 months.  Please call our office 2 months in advance to schedule this appointment.  You may see Dr. Marlou Porch or one of the following Advanced Practice Providers on your designated Care Team:   Truitt Merle, NP Cecilie Kicks, NP . Kathyrn Drown, NP  Any Other Special Instructions Will Be Listed Below (If Applicable).

## 2018-07-13 ENCOUNTER — Other Ambulatory Visit: Payer: Self-pay

## 2018-07-13 DIAGNOSIS — L309 Dermatitis, unspecified: Secondary | ICD-10-CM

## 2018-07-20 ENCOUNTER — Other Ambulatory Visit: Payer: Self-pay

## 2018-07-20 DIAGNOSIS — L309 Dermatitis, unspecified: Secondary | ICD-10-CM

## 2018-07-30 ENCOUNTER — Encounter (HOSPITAL_COMMUNITY): Payer: Medicare Other

## 2018-08-06 ENCOUNTER — Ambulatory Visit (INDEPENDENT_AMBULATORY_CARE_PROVIDER_SITE_OTHER): Payer: Medicare Other | Admitting: Pharmacist

## 2018-08-06 DIAGNOSIS — E7849 Other hyperlipidemia: Secondary | ICD-10-CM

## 2018-08-06 NOTE — Progress Notes (Signed)
Patient ID: Evelyn Heath                 DOB: [birth year]                    MRN: [id number]     HPI: Evelyn Heath is a 73 y.o. female patient referred to lipid clinic by Dr. Marlou Porch. PMH is significant for HLD, HTN, anxiety, and osteoporosis. Bilateral carotid ultrasound from July 2007 showed mild bilateral carotid atherosclerosis, calcium score from 06/2016 showed CAD - score of 128 which was 77th percentile. Pt has a history of statin intolerance and baseline LDL > 200. She was started on Praluent injections in 06/2016 which improved her LDL to 99. Patient was last seen in December by Dr. Marlou Porch and patient was hesistant to increase Praluent dose from 75 mg to 150 mg to target LDL goal <70 mg/dL. Patient presents today for lipid management and concerns regarding leg discomfort.  Patient is in good spirits today. Patient is still experiencing leg pain, which started a couple of months ago. Assured patient that medication associated side effects would normally occur within few months of initiating Praluent. Patient also reports worsening pain when she stands on her feet for too long. Pain is not worse the few days after injecting her Praluent. Patient reports that doctor ruled out arthritis. Patient reports limited physical activity and weight gain. Encouraged patient to increase low impact physical activity such as swimming and elliptical given chronic knee pain. Patient is agreeable as she wishes to lose some weight.  Current Medications: Praluent 75 mg/mL q2w  Intolerances: Lipitor - hand numbness, Crestor 20mg  daily (2011)- myalgias, lovastatin 20mg  daily (2014) - myalgias, Zetia 10mg  dailyand Welchol 1,875mg   BID - myalgias   Risk Factors: mild bilateral carotid atherosclerosis, calcium score 77th percentile  LDL goal: 70mg /dL due to CAD and calcium score 77th percentile  Diet: Does not eat fried foods. Eating more vegetables. Limiting herself to 1200 calories a day using a calorie  counter app. Low calorie ice cream as a snack.  Exercise: Limited. Goes to [employer] occassionally  Family History: No early family history of CAD. Paternal grandfather died of CHF. Brother and 2 sisters with no cardiac issues.  Social History: Retired Clinical biochemist. Quit smoking in 2013.  Labs: 10/12/17: TC 176, TG 102, HDL 57, LDL 99 (Praluent 75mg /mL Q2W) 05/25/16: TC 332, HDL 66, TG 145, LDL 237, non-HDL 266 at [facility] [place] with Dr Drema Dallas  Past Medical History:  Diagnosis Date  . Anxiety   . Borderline osteopenia   . Elevated cholesterol with elevated triglycerides   . History of shingles 10/07; 1/08; 2/08   per patient  . Hyperlipidemia   . Hypertension   . Insomnia, persistent   . Menopausal state 2000   no HRT  . Osteoporosis   . Vitamin deficiency     Current Outpatient Medications on File Prior to Visit  Medication Sig Dispense Refill  . Alirocumab (PRALUENT) 75 MG/ML SOPN Inject 1 pen into the skin every 14 (fourteen) days. 6 pen 3  . ALPRAZolam (XANAX) 0.25 MG tablet Take 0.25 mg by mouth at bedtime as needed for anxiety or sleep.     . B Complex Vitamins (VITAMIN B COMPLEX PO) TAKE 1 TABLET BY MOUTH EVERY 2-3 WEEKS    . cholecalciferol (VITAMIN D) 1000 UNITS tablet Take 2,000 Units by mouth daily.    Marland Kitchen FLUoxetine (PROZAC) 20 MG capsule Take 20 mg by mouth  every morning.     Marland Kitchen levocetirizine (XYZAL) 5 MG tablet Take 1 tablet (5 mg total) by mouth every evening. 34 tablet 5  . Melatonin 1 MG CAPS Take 3 mg by mouth at bedtime.     Marland Kitchen omeprazole (PRILOSEC) 20 MG capsule Take 20 mg by mouth 2 (two) times a week.     No current facility-administered medications on file prior to visit.     Allergies  Allergen Reactions  . Bactrim [Sulfamethoxazole-Trimethoprim] Nausea Only  . Lipitor [Atorvastatin]     Numbness in fingers  . Meloxicam     Upset stomach  . Statins     numbness  . Terramycin [Oxytetracycline]   . Codeine Rash  . Crestor [Rosuvastatin]   .  Tetracyclines & Related Rash    Rash in mouth    Assessment/Plan:  1. Hyperlipidemia - LDL above goal <70 mg/dL due to CAD and calcium score 77th percentile. Patient is unwilling to increase dose of Praluent given her concerns over leg pain. When presented with various options, such as switching to Repatha, holding a couple of doses of Praluent, or increasing dose of Praluent, patient wishes to continue her current regimen. Will submit new prior authorization since she has a new insurance plan for 2020. Will continue Praluent 75 mg/mL q2w. Since pain does not worsen the few days after injections, the pain has presented after 3 years of tolerating Praluent, and seems to be worse with physical activity, it does not sound as though symptoms are related to Praluent. Will follow up in 3 months with lipid panel after lifestyle modifications are made.  Patient seen in clinic by Almira Bar, PharmD Candidate  Evelyn Heath, PharmD, BCACP, [facility] [address] [place],  [address] Phone: [phone]; Fax: [phone] 08/06/2018 4:14 PM

## 2018-08-09 ENCOUNTER — Encounter (INDEPENDENT_AMBULATORY_CARE_PROVIDER_SITE_OTHER): Payer: Self-pay

## 2018-08-09 ENCOUNTER — Ambulatory Visit (HOSPITAL_COMMUNITY)
Admission: RE | Admit: 2018-08-09 | Discharge: 2018-08-09 | Disposition: A | Discharge status: Home / Self Care | Payer: Medicare Other | Source: Ambulatory Visit | Attending: Cardiology | Admitting: Cardiology

## 2018-08-09 DIAGNOSIS — R0989 Other specified symptoms and signs involving the circulatory and respiratory systems: Secondary | ICD-10-CM | POA: Insufficient documentation

## 2018-08-17 ENCOUNTER — Other Ambulatory Visit: Payer: Self-pay | Admitting: Pharmacist

## 2018-08-17 MED ORDER — ALIROCUMAB 75 MG/ML ~~LOC~~ SOAJ: 1.0000 "pen " | SUBCUTANEOUS | 11 refills | Status: DC

## 2018-08-27 DIAGNOSIS — F419 Anxiety disorder, unspecified: Secondary | ICD-10-CM | POA: Diagnosis not present

## 2018-08-27 DIAGNOSIS — F339 Major depressive disorder, recurrent, unspecified: Secondary | ICD-10-CM | POA: Diagnosis not present

## 2018-08-27 DIAGNOSIS — E559 Vitamin D deficiency, unspecified: Secondary | ICD-10-CM | POA: Diagnosis not present

## 2018-08-27 DIAGNOSIS — N183 Chronic kidney disease, stage 3 (moderate): Secondary | ICD-10-CM | POA: Diagnosis not present

## 2018-08-27 DIAGNOSIS — G479 Sleep disorder, unspecified: Secondary | ICD-10-CM | POA: Diagnosis not present

## 2018-08-27 DIAGNOSIS — I10 Essential (primary) hypertension: Secondary | ICD-10-CM | POA: Diagnosis not present

## 2018-08-27 DIAGNOSIS — E041 Nontoxic single thyroid nodule: Secondary | ICD-10-CM | POA: Diagnosis not present

## 2018-08-27 DIAGNOSIS — R7301 Impaired fasting glucose: Secondary | ICD-10-CM | POA: Diagnosis not present

## 2018-08-27 DIAGNOSIS — E7800 Pure hypercholesterolemia, unspecified: Secondary | ICD-10-CM | POA: Diagnosis not present

## 2018-08-29 ENCOUNTER — Other Ambulatory Visit: Payer: Self-pay | Admitting: Family Medicine

## 2018-08-29 DIAGNOSIS — E041 Nontoxic single thyroid nodule: Secondary | ICD-10-CM

## 2018-09-03 ENCOUNTER — Ambulatory Visit
Admission: RE | Admit: 2018-09-03 | Discharge: 2018-09-03 | Disposition: A | Discharge status: Home / Self Care | Payer: Medicare Other | Source: Ambulatory Visit | Attending: Family Medicine | Admitting: Family Medicine

## 2018-09-03 DIAGNOSIS — E041 Nontoxic single thyroid nodule: Secondary | ICD-10-CM

## 2018-09-03 DIAGNOSIS — E042 Nontoxic multinodular goiter: Secondary | ICD-10-CM | POA: Diagnosis not present

## 2018-09-04 ENCOUNTER — Other Ambulatory Visit: Payer: Medicare Other

## 2018-09-04 DIAGNOSIS — E7849 Other hyperlipidemia: Secondary | ICD-10-CM | POA: Diagnosis not present

## 2018-09-04 LAB — HEPATIC FUNCTION PANEL
ALBUMIN: 4.1 g/dL (ref 3.7–4.7)
ALT: 13 IU/L (ref 0–32)
AST: 15 IU/L (ref 0–40)
Alkaline Phosphatase: 74 IU/L (ref 39–117)
Bilirubin Total: 0.3 mg/dL (ref 0.0–1.2)
Bilirubin, Direct: 0.08 mg/dL (ref 0.00–0.40)
TOTAL PROTEIN: 6.8 g/dL (ref 6.0–8.5)

## 2018-09-04 LAB — LIPID PANEL
CHOLESTEROL TOTAL: 211 mg/dL — AB (ref 100–199)
Chol/HDL Ratio: 3.3 ratio (ref 0.0–4.4)
HDL: 63 mg/dL (ref >39)
LDL CALC: 125 mg/dL — AB (ref 0–99)
Triglycerides: 115 mg/dL (ref 0–149)
VLDL Cholesterol Cal: 23 mg/dL (ref 5–40)

## 2018-09-06 ENCOUNTER — Telehealth: Payer: Self-pay | Admitting: Pharmacist

## 2018-09-06 MED ORDER — ALIROCUMAB 150 MG/ML ~~LOC~~ SOAJ: 1.0000 "pen " | SUBCUTANEOUS | 11 refills | Status: DC

## 2018-09-06 NOTE — Telephone Encounter (Signed)
LDL has increased to 125 despite adherence with Praluent. Spoke with pt and she is willing to increase Praluent dose to 150mg . Rx sent to pharmacy and advised pt to call clinic once she has used 3-4 of the higher strength so that we can recheck labs.

## 2018-10-31 ENCOUNTER — Telehealth: Payer: Self-pay | Admitting: Pharmacist

## 2018-10-31 DIAGNOSIS — E7849 Other hyperlipidemia: Secondary | ICD-10-CM

## 2018-10-31 NOTE — Telephone Encounter (Signed)
Lab lipid appt r/s to June per pt request, new lab orders placed.

## 2018-11-05 ENCOUNTER — Other Ambulatory Visit: Payer: Medicare Other

## 2018-11-26 DIAGNOSIS — R42 Dizziness and giddiness: Secondary | ICD-10-CM | POA: Diagnosis not present

## 2018-11-26 DIAGNOSIS — J302 Other seasonal allergic rhinitis: Secondary | ICD-10-CM | POA: Diagnosis not present

## 2018-12-24 ENCOUNTER — Telehealth: Payer: Self-pay | Admitting: *Deleted

## 2018-12-24 NOTE — Telephone Encounter (Signed)

## 2018-12-26 ENCOUNTER — Encounter (INDEPENDENT_AMBULATORY_CARE_PROVIDER_SITE_OTHER): Payer: Self-pay

## 2018-12-26 ENCOUNTER — Other Ambulatory Visit: Payer: Self-pay

## 2018-12-26 ENCOUNTER — Other Ambulatory Visit: Payer: Medicare Other | Admitting: *Deleted

## 2018-12-26 DIAGNOSIS — E7849 Other hyperlipidemia: Secondary | ICD-10-CM

## 2018-12-26 LAB — LIPID PANEL
Chol/HDL Ratio: 3.3 ratio (ref 0.0–4.4)
Cholesterol, Total: 187 mg/dL (ref 100–199)
HDL: 56 mg/dL (ref >39)
LDL Calculated: 105 mg/dL — ABNORMAL HIGH (ref 0–99)
Triglycerides: 129 mg/dL (ref 0–149)
VLDL Cholesterol Cal: 26 mg/dL (ref 5–40)

## 2018-12-26 LAB — HEPATIC FUNCTION PANEL
ALT: 14 IU/L (ref 0–32)
AST: 17 IU/L (ref 0–40)
Albumin: 4 g/dL (ref 3.7–4.7)
Alkaline Phosphatase: 64 IU/L (ref 39–117)
Bilirubin Total: 0.3 mg/dL (ref 0.0–1.2)
Bilirubin, Direct: 0.08 mg/dL (ref 0.00–0.40)
Total Protein: 6.3 g/dL (ref 6.0–8.5)

## 2019-01-21 ENCOUNTER — Other Ambulatory Visit: Payer: Self-pay | Admitting: Family Medicine

## 2019-01-21 DIAGNOSIS — Z1231 Encounter for screening mammogram for malignant neoplasm of breast: Secondary | ICD-10-CM

## 2019-03-04 ENCOUNTER — Other Ambulatory Visit: Payer: Self-pay

## 2019-03-04 ENCOUNTER — Ambulatory Visit
Admission: RE | Admit: 2019-03-04 | Discharge: 2019-03-04 | Disposition: A | Discharge status: Home / Self Care | Payer: Medicare Other | Source: Ambulatory Visit | Attending: Family Medicine | Admitting: Family Medicine

## 2019-03-04 DIAGNOSIS — Z1231 Encounter for screening mammogram for malignant neoplasm of breast: Secondary | ICD-10-CM

## 2019-04-17 DIAGNOSIS — L820 Inflamed seborrheic keratosis: Secondary | ICD-10-CM | POA: Diagnosis not present

## 2019-04-17 DIAGNOSIS — L821 Other seborrheic keratosis: Secondary | ICD-10-CM | POA: Diagnosis not present

## 2019-05-21 ENCOUNTER — Telehealth: Payer: Self-pay | Admitting: *Deleted

## 2019-05-21 DIAGNOSIS — Z006 Encounter for examination for normal comparison and control in clinical research program: Secondary | ICD-10-CM

## 2019-05-21 NOTE — Telephone Encounter (Signed)
GOULD EDUInformed Consent  Subject Name:Evelyn Heath  Subject met inclusion and exclusion criteria. The informed consent form, study requirements and expectations were reviewed with the subject and questions and concerns were addressed prior to the signing of the consent form. The subject verbalized understanding of the trial requirements. The subject agreed to participate in the [place] and gave verbal consent to participate in the [place] 05/17/2019. The informed consent was obtained prior to performance of any protocol-specific procedures for the subject. A copy of the signed informed consent was mailedto the subject and a copy was placed in the subject's medical record.  Oletta Cohn.

## 2019-05-30 DIAGNOSIS — Z23 Encounter for immunization: Secondary | ICD-10-CM | POA: Diagnosis not present

## 2019-05-31 DIAGNOSIS — E7800 Pure hypercholesterolemia, unspecified: Secondary | ICD-10-CM | POA: Diagnosis not present

## 2019-05-31 DIAGNOSIS — I10 Essential (primary) hypertension: Secondary | ICD-10-CM | POA: Diagnosis not present

## 2019-05-31 DIAGNOSIS — F339 Major depressive disorder, recurrent, unspecified: Secondary | ICD-10-CM | POA: Diagnosis not present

## 2019-05-31 DIAGNOSIS — N183 Chronic kidney disease, stage 3 unspecified: Secondary | ICD-10-CM | POA: Diagnosis not present

## 2019-06-12 DIAGNOSIS — Z20828 Contact with and (suspected) exposure to other viral communicable diseases: Secondary | ICD-10-CM | POA: Diagnosis not present

## 2019-07-18 ENCOUNTER — Ambulatory Visit (INDEPENDENT_AMBULATORY_CARE_PROVIDER_SITE_OTHER): Payer: Medicare Other | Admitting: Cardiology

## 2019-07-18 ENCOUNTER — Encounter: Payer: Self-pay | Admitting: Cardiology

## 2019-07-18 ENCOUNTER — Other Ambulatory Visit: Payer: Self-pay

## 2019-07-18 VITALS — BP 148/80 | HR 64 | Ht 67.0 in | Wt 202.6 lb

## 2019-07-18 DIAGNOSIS — E785 Hyperlipidemia, unspecified: Secondary | ICD-10-CM

## 2019-07-18 DIAGNOSIS — I2510 Atherosclerotic heart disease of native coronary artery without angina pectoris: Secondary | ICD-10-CM

## 2019-07-18 DIAGNOSIS — E7849 Other hyperlipidemia: Secondary | ICD-10-CM | POA: Diagnosis not present

## 2019-07-18 DIAGNOSIS — R0989 Other specified symptoms and signs involving the circulatory and respiratory systems: Secondary | ICD-10-CM

## 2019-07-18 DIAGNOSIS — I6523 Occlusion and stenosis of bilateral carotid arteries: Secondary | ICD-10-CM

## 2019-07-18 NOTE — Progress Notes (Signed)
Cardiology Office Note:    Date:  07/18/2019   ID:  Evelyn, Heath 1945-11-12, MRN [id number]  PCP:  Leighton Ruff, MD  Cardiologist:  Candee Furbish, MD  Electrophysiologist:  None   Referring MD: Leighton Ruff, MD     History of Present Illness:    Evelyn Heath is a 73 y.o. female here for follow-up of hyperlipidemia.  Praluent injections.  CAD noted on CT scan coronary calcification, see below for details.  Previous: LDL 237, non-HDL 266, HDL 66, triglycerides 145, total cholesterol 332 on 05/25/16 performed at [facility] with Dr. Drema [place]. Unable to tolerate statins, Zetia, WelChol. She is working with [employer] and plans to start Nutrisystem. Anxiety, Prozac. Retired [employer]. Hemoglobin A1c 5.9. Creatinine 1.1, vitamin D 37, normal. ALT 15.  Lipitor - left 4-5 digits woke up numb Crestor - cramps Welchol - numbness in legs Zetia - leg numbness.   No early FHX of CAD. Paternal grandfather died CHF.  However her sister died in her 56s with comorbidity of atrial fibrillation, she was obese.  No diabetes, no current smoking, quit in 2013-just stopped.  Tried statins first in the 1990's.  She remained hesitant to increase Praluent dose from 75 mg to 150 mg to target LDL less than 70.  Overall LDL from 237-99.  She states that she is having some leg discomfort but she also has knee osteoarthritis as well.  Hard for her to determine the difference but she would like to talk with Megan and lipid clinic about this.  07/18/19-here for the follow-up of hyperlipidemia.  Doing very well with her Praluent injections, no side effects.  She has gained some weight over Covid.  Not able to go to the [facility].  Denies any fevers chills nausea vomiting syncope bleeding.  Past Medical History:  Diagnosis Date  . Anxiety   . Borderline osteopenia   . Elevated cholesterol with elevated triglycerides   . History of shingles 10/07; 1/08; 2/08   per patient  .  Hyperlipidemia   . Hypertension   . Insomnia, persistent   . Menopausal state 2000   no HRT  . Osteoporosis   . Vitamin deficiency     Past Surgical History:  Procedure Laterality Date  . ABDOMINAL EXPLORATION SURGERY  1981/ 1982   following ectopic pregnancy  . KNEE ARTHROSCOPY W/ ACL RECONSTRUCTION     left  . KNEE SURGERY     left    Current Medications: Current Meds  Medication Sig  . Alirocumab (PRALUENT) 150 MG/ML SOAJ Inject 1 pen into the skin every 14 (fourteen) days.  . ALPRAZolam (XANAX) 0.25 MG tablet Take 0.25 mg by mouth at bedtime as needed for anxiety or sleep.   . B Complex Vitamins (VITAMIN B COMPLEX PO) TAKE 1 TABLET BY MOUTH EVERY 2-3 WEEKS  . cholecalciferol (VITAMIN D) 1000 UNITS tablet Take 2,000 Units by mouth daily.  Marland Kitchen FLUoxetine (PROZAC) 20 MG capsule Take 20 mg by mouth every morning.   . Melatonin 1 MG CAPS Take 3 mg by mouth at bedtime.   Marland Kitchen omeprazole (PRILOSEC) 20 MG capsule Take 20 mg by mouth 2 (two) times a week.     Allergies:   Bactrim [sulfamethoxazole-trimethoprim], Lipitor [atorvastatin], Meloxicam, Statins, Terramycin [oxytetracycline], Codeine, Crestor [rosuvastatin], and Tetracyclines & related   Social History   Socioeconomic History  . Marital status: Married    Spouse name: Evelyn Heath  . Number of children: 1  . Years of education: Not on  file  . Highest education level: Not on file  Occupational History  . Occupation: Charity fundraiser for [employer]  . Occupation: retired  Tobacco Use  . Smoking status: Former Smoker    Packs/day: 0.50    Types: Cigarettes  . Smokeless tobacco: Never Used  Substance and Sexual Activity  . Alcohol use: Yes    Comment: rare wine  . Drug use: No  . Sexual activity: Yes    Partners: Male    Birth control/protection: Post-menopausal  Other Topics Concern  . Not on file  Social History Narrative  . Not on file   Social Determinants of Health   Financial Resource Strain:    . Difficulty of Paying Living Expenses: Not on file  Food Insecurity:   . Worried About Charity fundraiser in the Last Year: Not on file  . Ran Out of Food in the Last Year: Not on file  Transportation Needs:   . Lack of Transportation (Medical): Not on file  . Lack of Transportation (Non-Medical): Not on file  Physical Activity:   . Days of Exercise per Week: Not on file  . Minutes of Exercise per Session: Not on file  Stress:   . Feeling of Stress : Not on file  Social Connections:   . Frequency of Communication with Friends and Family: Not on file  . Frequency of Social Gatherings with Friends and Family: Not on file  . Attends Religious Services: Not on file  . Active Member of Clubs or Organizations: Not on file  . Attends Archivist Meetings: Not on file  . Marital Status: Not on file     Family History: The patient's family history includes Breast cancer in her maternal grandmother; Cancer in her maternal grandmother and mother; Cancer (age of onset: 32) in her father; Diabetes (age of onset: 11) in her father. There is no history of Angioedema, Allergic rhinitis, Asthma, Eczema, Immunodeficiency, or Urticaria.  ROS:   Please see the history of present illness.    Denies any fevers chills nausea vomiting syncope bleeding all other systems reviewed and are negative.  EKGs/Labs/Other Studies Reviewed:    The following studies were reviewed today: Prior office notes lab work LDL EKG  EKG:  EKG is  ordered today.  The ekg ordered today demonstrates 07/18/2019-sinus rhythm 64 with no other significant abnormalities personally reviewed-prior 07/09/2018-sinus rhythm no other abnormalities personally reviewed and interpreted.  Recent Labs: 12/26/2018: ALT 14  Recent Lipid Panel    Component Value Date/Time   CHOL 187 12/26/2018 0740   TRIG 129 12/26/2018 0740   HDL 56 12/26/2018 0740   CHOLHDL 3.3 12/26/2018 0740   LDLCALC 105 (H) 12/26/2018 0740    Physical  Exam:    VS:  BP (!) 148/80   Pulse 64   Ht 5\' 7"  (1.702 m)   Wt 202 lb 9.6 oz (91.9 kg)   LMP  (LMP Unknown)   SpO2 96%   BMI 31.73 kg/m     Wt Readings from Last 3 Encounters:  07/18/19 202 lb 9.6 oz (91.9 kg)  07/09/18 198 lb 12.8 oz (90.2 kg)  07/06/17 192 lb 12.8 oz (87.5 kg)     GEN: Well nourished, well developed, in no acute distress  HEENT: normal  Neck: no JVD, left carotid bruit, or masses Cardiac: RRR; no murmurs, rubs, or gallops,no edema  Respiratory:  clear to auscultation bilaterally, normal work of breathing GI: soft, nontender, nondistended, + BS MS: no  deformity or atrophy  Skin: warm and dry, no rash Neuro:  Alert and Oriented x 3, Strength and sensation are intact Psych: euthymic mood, full affect   ASSESSMENT:    1. Hyperlipidemia LDL goal <70   2. Familial hyperlipidemia   3. Atherosclerosis of native coronary artery of native heart without angina pectoris   4. Left carotid bruit   5. 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  Praluent  Appreciate help from Megan supple and lipid clinic.  No side effects  Left-sided carotid bruit -Carotid Doppler 07/2018 showed mild carotid plaque bilaterally.  Likely tortuous left carotid causing bruit.  Continue with lipid-lowering therapy.  Coronary artery calcium -Noted on prior CT.  Continue with aggressive risk factor modification.  Praluent  Statin intolerance - Currently on Praluent150 -Checking lipid panel today.  Last LDL was 105 in June 2020.  ALTs have been normal.  Medication Adjustments/Labs and Tests Ordered: Current medicines are reviewed at length with the patient today.  Concerns regarding medicines are outlined above.  Orders Placed This Encounter  Procedures  . Lipid Profile  . EKG 12-Lead   No orders of the defined types were placed in this encounter.   Patient Instructions   Medication Instructions:  The current medical regimen is effective;  continue present plan and medications.  *If you need a refill on your cardiac medications before your next appointment, please call your pharmacy*  Lab Work: Please have blood work today.  If you have labs (blood work) drawn today and your tests are completely normal, you will receive your results only by: Marland Kitchen MyChart Message (if you have MyChart) OR . A paper copy in the mail If you have any lab test that is abnormal or we need to change your treatment, we will call you to review the results.  Follow-Up: At [facility], you and your health needs are our priority.  As part of our continuing mission to provide you with exceptional heart care, we have created designated Provider Care Teams.  These Care Teams include your primary Cardiologist (physician) and Advanced Practice Providers (APPs -  Physician Assistants and Nurse Practitioners) who all work together to provide you with the care you need, when you need it.  Your next appointment:   12 month(s)  The format for your next appointment:   In Person  Provider:   Candee Furbish, MD  Thank you for choosing [facility]!!        Signed, Candee Furbish, MD  07/18/2019 9:04 AM    [place]

## 2019-07-18 NOTE — Patient Instructions (Signed)
Medication Instructions:  The current medical regimen is effective;  continue present plan and medications.  *If you need a refill on your cardiac medications before your next appointment, please call your pharmacy*  Lab Work: Please have blood work today.  If you have labs (blood work) drawn today and your tests are completely normal, you will receive your results only by: Marland Kitchen MyChart Message (if you have MyChart) OR . A paper copy in the mail If you have any lab test that is abnormal or we need to change your treatment, we will call you to review the results.  Follow-Up: At [facility], you and your health needs are our priority.  As part of our continuing mission to provide you with exceptional heart care, we have created designated Provider Care Teams.  These Care Teams include your primary Cardiologist (physician) and Advanced Practice Providers (APPs -  Physician Assistants and Nurse Practitioners) who all work together to provide you with the care you need, when you need it.  Your next appointment:   12 month(s)  The format for your next appointment:   In Person  Provider:   Candee Furbish, MD  Thank you for choosing [facility]!!

## 2019-07-19 LAB — LIPID PANEL
Chol/HDL Ratio: 2.9 ratio (ref 0.0–4.4)
Cholesterol, Total: 206 mg/dL — ABNORMAL HIGH (ref 100–199)
HDL: 71 mg/dL (ref >39)
LDL Chol Calc (NIH): 111 mg/dL — ABNORMAL HIGH (ref 0–99)
Triglycerides: 138 mg/dL (ref 0–149)
VLDL Cholesterol Cal: 24 mg/dL (ref 5–40)

## 2019-07-24 ENCOUNTER — Telehealth: Payer: Self-pay | Admitting: Pharmacist

## 2019-07-24 NOTE — Telephone Encounter (Signed)
Pt called clinic - states she will start with a new Part D insurance plan in January 2021.  [place] ID: [id number] BIN: [id number] GRP: [id number] PCN: [id number]  Advised pt we will submit new PA for Praluent continuation in January. Pt prefers refill be sent to [facility] in [place] - she will update them with her new insurance card.

## 2019-07-29 NOTE — Telephone Encounter (Signed)
Called and let the patient know that the pa has been sent to plan

## 2019-07-30 DIAGNOSIS — Z20828 Contact with and (suspected) exposure to other viral communicable diseases: Secondary | ICD-10-CM | POA: Diagnosis not present

## 2019-08-08 ENCOUNTER — Other Ambulatory Visit: Payer: Self-pay | Admitting: Pharmacist

## 2019-08-08 MED ORDER — PRALUENT 150 MG/ML ~~LOC~~ SOAJ: 1.0000 "pen " | SUBCUTANEOUS | 11 refills | Status: DC

## 2019-08-09 DIAGNOSIS — Z711 Person with feared health complaint in whom no diagnosis is made: Secondary | ICD-10-CM | POA: Diagnosis not present

## 2019-08-15 DIAGNOSIS — E7800 Pure hypercholesterolemia, unspecified: Secondary | ICD-10-CM | POA: Diagnosis not present

## 2019-08-15 DIAGNOSIS — I10 Essential (primary) hypertension: Secondary | ICD-10-CM | POA: Diagnosis not present

## 2019-08-15 DIAGNOSIS — F339 Major depressive disorder, recurrent, unspecified: Secondary | ICD-10-CM | POA: Diagnosis not present

## 2019-09-04 DIAGNOSIS — F339 Major depressive disorder, recurrent, unspecified: Secondary | ICD-10-CM | POA: Diagnosis not present

## 2019-09-04 DIAGNOSIS — E7800 Pure hypercholesterolemia, unspecified: Secondary | ICD-10-CM | POA: Diagnosis not present

## 2019-09-04 DIAGNOSIS — I10 Essential (primary) hypertension: Secondary | ICD-10-CM | POA: Diagnosis not present

## 2020-01-20 ENCOUNTER — Other Ambulatory Visit: Payer: Self-pay | Admitting: Family Medicine

## 2020-01-20 DIAGNOSIS — Z1231 Encounter for screening mammogram for malignant neoplasm of breast: Secondary | ICD-10-CM

## 2020-03-04 ENCOUNTER — Ambulatory Visit
Admission: RE | Admit: 2020-03-04 | Discharge: 2020-03-04 | Disposition: A | Discharge status: Home / Self Care | Payer: Medicare Other | Source: Ambulatory Visit | Attending: Family Medicine | Admitting: Family Medicine

## 2020-03-04 ENCOUNTER — Other Ambulatory Visit: Payer: Self-pay

## 2020-03-04 DIAGNOSIS — Z1231 Encounter for screening mammogram for malignant neoplasm of breast: Secondary | ICD-10-CM

## 2020-03-25 DIAGNOSIS — M79672 Pain in left foot: Secondary | ICD-10-CM | POA: Diagnosis not present

## 2020-03-25 DIAGNOSIS — L565 Disseminated superficial actinic porokeratosis (DSAP): Secondary | ICD-10-CM | POA: Diagnosis not present

## 2020-05-07 DIAGNOSIS — Z23 Encounter for immunization: Secondary | ICD-10-CM | POA: Diagnosis not present

## 2020-05-28 DIAGNOSIS — I10 Essential (primary) hypertension: Secondary | ICD-10-CM | POA: Diagnosis not present

## 2020-05-28 DIAGNOSIS — N183 Chronic kidney disease, stage 3 unspecified: Secondary | ICD-10-CM | POA: Diagnosis not present

## 2020-05-28 DIAGNOSIS — F339 Major depressive disorder, recurrent, unspecified: Secondary | ICD-10-CM | POA: Diagnosis not present

## 2020-05-28 DIAGNOSIS — K219 Gastro-esophageal reflux disease without esophagitis: Secondary | ICD-10-CM | POA: Diagnosis not present

## 2020-05-28 DIAGNOSIS — E7800 Pure hypercholesterolemia, unspecified: Secondary | ICD-10-CM | POA: Diagnosis not present

## 2020-06-30 DIAGNOSIS — N183 Chronic kidney disease, stage 3 unspecified: Secondary | ICD-10-CM | POA: Diagnosis not present

## 2020-06-30 DIAGNOSIS — F339 Major depressive disorder, recurrent, unspecified: Secondary | ICD-10-CM | POA: Diagnosis not present

## 2020-06-30 DIAGNOSIS — E559 Vitamin D deficiency, unspecified: Secondary | ICD-10-CM | POA: Diagnosis not present

## 2020-06-30 DIAGNOSIS — E7800 Pure hypercholesterolemia, unspecified: Secondary | ICD-10-CM | POA: Diagnosis not present

## 2020-06-30 DIAGNOSIS — R7303 Prediabetes: Secondary | ICD-10-CM | POA: Diagnosis not present

## 2020-06-30 DIAGNOSIS — I10 Essential (primary) hypertension: Secondary | ICD-10-CM | POA: Diagnosis not present

## 2020-06-30 DIAGNOSIS — G479 Sleep disorder, unspecified: Secondary | ICD-10-CM | POA: Diagnosis not present

## 2020-06-30 DIAGNOSIS — K219 Gastro-esophageal reflux disease without esophagitis: Secondary | ICD-10-CM | POA: Diagnosis not present

## 2020-06-30 DIAGNOSIS — N739 Female pelvic inflammatory disease, unspecified: Secondary | ICD-10-CM | POA: Diagnosis not present

## 2020-06-30 DIAGNOSIS — M8588 Other specified disorders of bone density and structure, other site: Secondary | ICD-10-CM | POA: Diagnosis not present

## 2020-06-30 DIAGNOSIS — F419 Anxiety disorder, unspecified: Secondary | ICD-10-CM | POA: Diagnosis not present

## 2020-06-30 DIAGNOSIS — Z0000 Encounter for general adult medical examination without abnormal findings: Secondary | ICD-10-CM | POA: Diagnosis not present

## 2020-07-09 DIAGNOSIS — E878 Other disorders of electrolyte and fluid balance, not elsewhere classified: Secondary | ICD-10-CM | POA: Diagnosis not present

## 2020-07-13 DIAGNOSIS — R0989 Other specified symptoms and signs involving the circulatory and respiratory systems: Secondary | ICD-10-CM | POA: Diagnosis not present

## 2020-07-13 DIAGNOSIS — Z20822 Contact with and (suspected) exposure to covid-19: Secondary | ICD-10-CM | POA: Diagnosis not present

## 2020-07-16 ENCOUNTER — Other Ambulatory Visit: Payer: Self-pay | Admitting: Family Medicine

## 2020-07-16 DIAGNOSIS — M8580 Other specified disorders of bone density and structure, unspecified site: Secondary | ICD-10-CM

## 2020-07-23 ENCOUNTER — Other Ambulatory Visit: Payer: Self-pay

## 2020-07-23 ENCOUNTER — Ambulatory Visit (INDEPENDENT_AMBULATORY_CARE_PROVIDER_SITE_OTHER): Payer: Medicare Other | Admitting: Cardiology

## 2020-07-23 ENCOUNTER — Encounter: Payer: Self-pay | Admitting: Cardiology

## 2020-07-23 VITALS — BP 140/80 | HR 60 | Ht 67.0 in | Wt 200.0 lb

## 2020-07-23 DIAGNOSIS — E7849 Other hyperlipidemia: Secondary | ICD-10-CM

## 2020-07-23 DIAGNOSIS — R0989 Other specified symptoms and signs involving the circulatory and respiratory systems: Secondary | ICD-10-CM

## 2020-07-23 DIAGNOSIS — I2510 Atherosclerotic heart disease of native coronary artery without angina pectoris: Secondary | ICD-10-CM | POA: Diagnosis not present

## 2020-07-23 MED ORDER — ASPIRIN EC 81 MG PO TBEC: 81.0000 mg | DELAYED_RELEASE_TABLET | Freq: Every day | ORAL | 3 refills | Status: DC

## 2020-07-23 NOTE — Progress Notes (Signed)
Cardiology Office Note:    Date:  07/23/2020   ID:  Evelyn Heath, DOB 06-08-1946, MRN [id number]  PCP:  Evelyn Heath, Elizabeth, MD  [facility] Cardiologist:  Evelyn SchultzMark Marketa Midkiff, MD  [facility] Electrophysiologist:  None   Referring MD: Evelyn Heath, Elizabeth, MD     History of Present Illness:    Evelyn Heath is a 74 y.o. female here for the follow-up of hyperlipidemia on PCSK9, Praluent, coronary calcification seen on CT scan.  Has battled with knee osteoarthritis at times but overall doing very well with her Praluent without any significant side effects.   Previous: LDL 237, non-HDL 266, HDL 66, triglycerides 145, total cholesterol 332 on [facility]25/[facility] Dr. Zachery DauerBarnes. Unable to tolerate statins, Zetia, WelChol. She is working with [employer] and plans to start Nutrisystem. Anxiety, Prozac. 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 Hemoglobin A1c 5.9. Creatinine 1.1, vitamin D 37, normal. ALT 15.  Lipitor - left 4-5 digits woke up numb Crestor - cramps Welchol - numbness in legs Zetia - leg numbness.   No early FHX of CAD. Paternal grandfather died CHF.  However her sister died in her 1370s with comorbidity of atrial fibrillation, she was obese.  No diabetes, no current smoking, quit in 2013-just stopped.  Tried statins first in the 1990's.  She remained hesitant to increase Praluent dose from 75 mg to 150 mg to target LDL less than 70.  Overall LDL from 237-99.  Seems to doing quite well.  Wishes that she could swim but Covid has shutdown many colds.  Past Medical History:  Diagnosis Date  . Anxiety   . Borderline osteopenia   . Elevated cholesterol with elevated triglycerides   . History of shingles 10/07; 1/08; 2/08   per patient  . Hyperlipidemia   . Hypertension   . Insomnia, persistent   . Menopausal state 2000   no HRT  . Osteoporosis   . Vitamin deficiency     Past Surgical History:  Procedure Laterality Date  . ABDOMINAL EXPLORATION SURGERY  1981/  1982   following ectopic pregnancy  . KNEE ARTHROSCOPY W/ ACL RECONSTRUCTION     left  . KNEE SURGERY     left    Current Medications: Current Meds  Medication Sig  . Alirocumab (PRALUENT) 150 MG/ML SOAJ Inject 1 pen into the skin every 14 (fourteen) days.  . ALPRAZolam (XANAX) 0.25 MG tablet Take 0.25 mg by mouth at bedtime as needed for anxiety or sleep.   Marland Kitchen. aspirin EC 81 MG tablet Take 1 tablet (81 mg total) by mouth daily. Swallow whole.  . B Complex Vitamins (VITAMIN B COMPLEX PO) TAKE 1 TABLET BY MOUTH EVERY 2-3 WEEKS  . cholecalciferol (VITAMIN D) 1000 UNITS tablet Take 2,000 Units by mouth daily.  Marland Kitchen. FLUoxetine (PROZAC) 20 MG capsule Take 20 mg by mouth every morning.   . Melatonin 1 MG CAPS Take 3 mg by mouth at bedtime.   Marland Kitchen. omeprazole (PRILOSEC) 20 MG capsule Take 20 mg by mouth 2 (two) times a week.     Allergies:   Bactrim [sulfamethoxazole-trimethoprim], Lipitor [atorvastatin], Meloxicam, Statins, Terramycin [oxytetracycline], Codeine, Crestor [rosuvastatin], and Tetracyclines & related   Social History   Socioeconomic History  . Marital status: Married    Spouse name: Evelyn Heath  . Number of children: 1  . Years of education: Not on file  . Highest education level: Not on file  Occupational History  . Occupation: Recruitment consultantHuman resource Manager for [employer]  . Occupation: retired  Tobacco Use  . Smoking status: Former Smoker    Packs/day: 0.50    Types: Cigarettes  . Smokeless tobacco: Never Used  Substance and Sexual Activity  . Alcohol use: Yes    Comment: rare wine  . Drug use: No  . Sexual activity: Yes    Partners: Male    Birth control/protection: Post-menopausal  Other Topics Concern  . Not on file  Social History Narrative  . Not on file   Social Determinants of Health   Financial Resource Strain: Not on file  Food Insecurity: Not on file  Transportation Needs: Not on file  Physical Activity: Not on file  Stress: Not on file  Social  Connections: Not on file     Family History: The patient's family history includes Breast cancer in her maternal grandmother; Cancer in her maternal grandmother and mother; Cancer (age of onset: 44) in her father; Diabetes (age of onset: 52) in her father. There is no history of Angioedema, Allergic rhinitis, Asthma, Eczema, Immunodeficiency, or Urticaria.  ROS:   Please see the history of present illness.     All other systems reviewed and are negative.  EKGs/Labs/Other Studies Reviewed:    EKG:  EKG is  ordered today.  The ekg ordered today demonstrates sinus rhythm 60 07/18/2019-sinus rhythm 64 with no other significant abnormalities personally reviewed-prior 07/09/2018-sinus rhythm no other abnormalities personally reviewed and interpreted Recent Labs: No results found for requested labs within last 8760 hours.  Recent Lipid Panel    Component Value Date/Time   CHOL 206 (H) 07/18/2019 0850   TRIG 138 07/18/2019 0850   HDL 71 07/18/2019 0850   CHOLHDL 2.9 07/18/2019 0850   LDLCALC 111 (H) 07/18/2019 0850     Risk Assessment/Calculations:       Physical Exam:    VS:  BP 140/80 (BP Location: Left Arm, Patient Position: Sitting, Cuff Size: Normal)   Pulse 60   Ht 5\' 7"  (1.702 m)   Wt 200 lb (90.7 kg)   LMP  (LMP Unknown)   SpO2 96%   BMI 31.32 kg/m     Wt Readings from Last 3 Encounters:  07/23/20 200 lb (90.7 kg)  07/18/19 202 lb 9.6 oz (91.9 kg)  07/09/18 198 lb 12.8 oz (90.2 kg)     GEN:  Well nourished, well developed in no acute distress HEENT: Normal NECK: No JVD; No carotid bruits LYMPHATICS: No lymphadenopathy CARDIAC: RRR, no murmurs, rubs, gallops RESPIRATORY:  Clear to auscultation without rales, wheezing or rhonchi  ABDOMEN: Soft, non-tender, non-distended MUSCULOSKELETAL:  No edema; No deformity  SKIN: Warm and dry NEUROLOGIC:  Alert and oriented x 3 PSYCHIATRIC:  Normal affect   ASSESSMENT:    1. Familial hyperlipidemia   2. Atherosclerosis  of native coronary artery of native heart without angina pectoris   3. Left carotid bruit    PLAN:    In order of problems listed above:  Hyperlipidemia familial -Baseline LDL was 237.  Had problems with statins in the past.  Now taking Praluent, doing very well with the assistance of our lipid clinic.  No myalgias.  Continue with current medical management.  Coronary artery calcification -Personally reviewed on prior CT scan.  Continue with aggressive risk factor modification including Praluent.  Low-dose aspirin not unreasonable.  I have asked her to go ahead and start 81 mg.  She does occasionally have bruising on her arms.  She will monitor for any worrisome findings.  Hemoglobin 14.7, creatinine 1.1, LDL 131  Statin intolerance -Could not tolerate previous statin use.  Doing well on Praluent.  Left-sided carotid bruit -Likely tortuous branch causing murmur.  Previous carotid Doppler in 2020 showed mild carotid plaque bilaterally.  Continue with lipid-lowering therapy.  Aortic atherosclerosis -Seen on CT scan, coronary CT from 07/12/2016.  Continue with aggressive secondary risk factor prevention.        Medication Adjustments/Labs and Tests Ordered: Current medicines are reviewed at length with the patient today.  Concerns regarding medicines are outlined above.  Orders Placed This Encounter  Procedures  . EKG 12-Lead   Meds ordered this encounter  Medications  . aspirin EC 81 MG tablet    Sig: Take 1 tablet (81 mg total) by mouth daily. Swallow whole.    Dispense:  90 tablet    Refill:  3    Patient Instructions  Medication Instructions:   START TAKING ASPIRIN 81 MG BY MOUTH DAILY  *If you need a refill on your cardiac medications before your next appointment, please call your pharmacy*   Follow-Up: At [facility], you and your health needs are our priority.  As part of our continuing mission to provide you with exceptional heart care, we have created  designated Provider Care Teams.  These Care Teams include your primary Cardiologist (physician) and Advanced Practice Providers (APPs -  Physician Assistants and Nurse Practitioners) who all work together to provide you with the care you need, when you need it.  We recommend signing up for the patient portal called "MyChart".  Sign up information is provided on this After Visit Summary.  MyChart is used to connect with patients for Virtual Visits (Telemedicine).  Patients are able to view lab/test results, encounter notes, upcoming appointments, etc.  Non-urgent messages can be sent to your provider as well.   To learn more about what you can do with MyChart, go to [email or web address].    Your next appointment:   1 year(s)  The format for your next appointment:   In Person  Provider:   Donato Schultz, MD        Signed, Evelyn Schultz, MD  07/23/2020 12:41 PM    [facility]

## 2020-07-23 NOTE — Patient Instructions (Signed)
Medication Instructions:   START TAKING ASPIRIN 81 MG BY MOUTH DAILY  *If you need a refill on your cardiac medications before your next appointment, please call your pharmacy*   Follow-Up: At [facility], you and your health needs are our priority.  As part of our continuing mission to provide you with exceptional heart care, we have created designated Provider Care Teams.  These Care Teams include your primary Cardiologist (physician) and Advanced Practice Providers (APPs -  Physician Assistants and Nurse Practitioners) who all work together to provide you with the care you need, when you need it.  We recommend signing up for the patient portal called "MyChart".  Sign up information is provided on this After Visit Summary.  MyChart is used to connect with patients for Virtual Visits (Telemedicine).  Patients are able to view lab/test results, encounter notes, upcoming appointments, etc.  Non-urgent messages can be sent to your provider as well.   To learn more about what you can do with MyChart, go to [email or web address].    Your next appointment:   1 year(s)  The format for your next appointment:   In Person  Provider:   Donato Schultz, MD

## 2020-08-01 DIAGNOSIS — R059 Cough, unspecified: Secondary | ICD-10-CM | POA: Diagnosis not present

## 2020-08-01 DIAGNOSIS — U071 COVID-19: Secondary | ICD-10-CM | POA: Diagnosis not present

## 2020-08-17 ENCOUNTER — Telehealth: Payer: Self-pay | Admitting: Pharmacist

## 2020-08-17 NOTE — Telephone Encounter (Signed)
Pt called with new insurance for 2022 year, Praluent will need new authorization:  ID: [id number] BIN: [id number] PCN: [id number] GRP: [id number]

## 2020-08-17 NOTE — Telephone Encounter (Signed)
Pa submitted

## 2020-08-17 NOTE — Telephone Encounter (Signed)
Prior authorization approved until further notice, pt is aware.

## 2020-08-21 ENCOUNTER — Other Ambulatory Visit: Payer: Self-pay | Admitting: Cardiology

## 2020-09-03 DIAGNOSIS — I10 Essential (primary) hypertension: Secondary | ICD-10-CM | POA: Diagnosis not present

## 2020-09-03 DIAGNOSIS — K219 Gastro-esophageal reflux disease without esophagitis: Secondary | ICD-10-CM | POA: Diagnosis not present

## 2020-09-03 DIAGNOSIS — F339 Major depressive disorder, recurrent, unspecified: Secondary | ICD-10-CM | POA: Diagnosis not present

## 2020-09-03 DIAGNOSIS — E7800 Pure hypercholesterolemia, unspecified: Secondary | ICD-10-CM | POA: Diagnosis not present

## 2020-09-03 DIAGNOSIS — N183 Chronic kidney disease, stage 3 unspecified: Secondary | ICD-10-CM | POA: Diagnosis not present

## 2020-10-01 DIAGNOSIS — Z01419 Encounter for gynecological examination (general) (routine) without abnormal findings: Secondary | ICD-10-CM | POA: Diagnosis not present

## 2020-10-07 DIAGNOSIS — Z1159 Encounter for screening for other viral diseases: Secondary | ICD-10-CM | POA: Diagnosis not present

## 2020-10-07 DIAGNOSIS — R635 Abnormal weight gain: Secondary | ICD-10-CM | POA: Diagnosis not present

## 2020-10-07 DIAGNOSIS — R5383 Other fatigue: Secondary | ICD-10-CM | POA: Diagnosis not present

## 2020-10-07 DIAGNOSIS — Z79899 Other long term (current) drug therapy: Secondary | ICD-10-CM | POA: Diagnosis not present

## 2020-10-07 DIAGNOSIS — R0602 Shortness of breath: Secondary | ICD-10-CM | POA: Diagnosis not present

## 2020-10-07 DIAGNOSIS — E559 Vitamin D deficiency, unspecified: Secondary | ICD-10-CM | POA: Diagnosis not present

## 2020-10-21 DIAGNOSIS — R5383 Other fatigue: Secondary | ICD-10-CM | POA: Diagnosis not present

## 2020-10-21 DIAGNOSIS — Z79899 Other long term (current) drug therapy: Secondary | ICD-10-CM | POA: Diagnosis not present

## 2020-10-21 DIAGNOSIS — E669 Obesity, unspecified: Secondary | ICD-10-CM | POA: Diagnosis not present

## 2020-10-21 DIAGNOSIS — R635 Abnormal weight gain: Secondary | ICD-10-CM | POA: Diagnosis not present

## 2020-10-21 DIAGNOSIS — R0602 Shortness of breath: Secondary | ICD-10-CM | POA: Diagnosis not present

## 2020-10-27 ENCOUNTER — Other Ambulatory Visit: Payer: Medicare Other

## 2020-11-04 DIAGNOSIS — R635 Abnormal weight gain: Secondary | ICD-10-CM | POA: Diagnosis not present

## 2020-11-04 DIAGNOSIS — E669 Obesity, unspecified: Secondary | ICD-10-CM | POA: Diagnosis not present

## 2020-11-04 DIAGNOSIS — Z79899 Other long term (current) drug therapy: Secondary | ICD-10-CM | POA: Diagnosis not present

## 2020-12-02 DIAGNOSIS — R635 Abnormal weight gain: Secondary | ICD-10-CM | POA: Diagnosis not present

## 2020-12-02 DIAGNOSIS — Z79899 Other long term (current) drug therapy: Secondary | ICD-10-CM | POA: Diagnosis not present

## 2020-12-02 DIAGNOSIS — E669 Obesity, unspecified: Secondary | ICD-10-CM | POA: Diagnosis not present

## 2020-12-04 DIAGNOSIS — K219 Gastro-esophageal reflux disease without esophagitis: Secondary | ICD-10-CM | POA: Diagnosis not present

## 2020-12-04 DIAGNOSIS — E7800 Pure hypercholesterolemia, unspecified: Secondary | ICD-10-CM | POA: Diagnosis not present

## 2020-12-04 DIAGNOSIS — N183 Chronic kidney disease, stage 3 unspecified: Secondary | ICD-10-CM | POA: Diagnosis not present

## 2020-12-04 DIAGNOSIS — I10 Essential (primary) hypertension: Secondary | ICD-10-CM | POA: Diagnosis not present

## 2020-12-04 DIAGNOSIS — F339 Major depressive disorder, recurrent, unspecified: Secondary | ICD-10-CM | POA: Diagnosis not present

## 2020-12-29 DIAGNOSIS — F3341 Major depressive disorder, recurrent, in partial remission: Secondary | ICD-10-CM | POA: Diagnosis not present

## 2020-12-29 DIAGNOSIS — R7303 Prediabetes: Secondary | ICD-10-CM | POA: Diagnosis not present

## 2020-12-29 DIAGNOSIS — N183 Chronic kidney disease, stage 3 unspecified: Secondary | ICD-10-CM | POA: Diagnosis not present

## 2020-12-29 DIAGNOSIS — Z7184 Encounter for health counseling related to travel: Secondary | ICD-10-CM | POA: Diagnosis not present

## 2020-12-29 DIAGNOSIS — E7800 Pure hypercholesterolemia, unspecified: Secondary | ICD-10-CM | POA: Diagnosis not present

## 2020-12-31 DIAGNOSIS — R0602 Shortness of breath: Secondary | ICD-10-CM | POA: Diagnosis not present

## 2020-12-31 DIAGNOSIS — R635 Abnormal weight gain: Secondary | ICD-10-CM | POA: Diagnosis not present

## 2020-12-31 DIAGNOSIS — E559 Vitamin D deficiency, unspecified: Secondary | ICD-10-CM | POA: Diagnosis not present

## 2020-12-31 DIAGNOSIS — Z79899 Other long term (current) drug therapy: Secondary | ICD-10-CM | POA: Diagnosis not present

## 2020-12-31 DIAGNOSIS — Z23 Encounter for immunization: Secondary | ICD-10-CM | POA: Diagnosis not present

## 2020-12-31 DIAGNOSIS — Z20822 Contact with and (suspected) exposure to covid-19: Secondary | ICD-10-CM | POA: Diagnosis not present

## 2020-12-31 DIAGNOSIS — E7800 Pure hypercholesterolemia, unspecified: Secondary | ICD-10-CM | POA: Diagnosis not present

## 2020-12-31 DIAGNOSIS — E669 Obesity, unspecified: Secondary | ICD-10-CM | POA: Diagnosis not present

## 2020-12-31 DIAGNOSIS — R5383 Other fatigue: Secondary | ICD-10-CM | POA: Diagnosis not present

## 2021-01-05 DIAGNOSIS — Z7184 Encounter for health counseling related to travel: Secondary | ICD-10-CM | POA: Diagnosis not present

## 2021-01-05 DIAGNOSIS — E7800 Pure hypercholesterolemia, unspecified: Secondary | ICD-10-CM | POA: Diagnosis not present

## 2021-01-05 DIAGNOSIS — N183 Chronic kidney disease, stage 3 unspecified: Secondary | ICD-10-CM | POA: Diagnosis not present

## 2021-01-05 DIAGNOSIS — R7303 Prediabetes: Secondary | ICD-10-CM | POA: Diagnosis not present

## 2021-01-05 DIAGNOSIS — F3341 Major depressive disorder, recurrent, in partial remission: Secondary | ICD-10-CM | POA: Diagnosis not present

## 2021-01-21 ENCOUNTER — Other Ambulatory Visit: Payer: Self-pay | Admitting: Family Medicine

## 2021-01-21 DIAGNOSIS — Z1231 Encounter for screening mammogram for malignant neoplasm of breast: Secondary | ICD-10-CM

## 2021-01-28 DIAGNOSIS — Z79899 Other long term (current) drug therapy: Secondary | ICD-10-CM | POA: Diagnosis not present

## 2021-01-28 DIAGNOSIS — E7800 Pure hypercholesterolemia, unspecified: Secondary | ICD-10-CM | POA: Diagnosis not present

## 2021-01-28 DIAGNOSIS — R635 Abnormal weight gain: Secondary | ICD-10-CM | POA: Diagnosis not present

## 2021-01-28 DIAGNOSIS — E669 Obesity, unspecified: Secondary | ICD-10-CM | POA: Diagnosis not present

## 2021-01-28 DIAGNOSIS — Z23 Encounter for immunization: Secondary | ICD-10-CM | POA: Diagnosis not present

## 2021-01-28 DIAGNOSIS — Z20822 Contact with and (suspected) exposure to covid-19: Secondary | ICD-10-CM | POA: Diagnosis not present

## 2021-01-29 ENCOUNTER — Telehealth: Payer: Self-pay | Admitting: Pharmacist

## 2021-01-29 NOTE — Telephone Encounter (Signed)
Returned call to pt. She states she is bruising a lot on aspirin 81mg  daily to the point where friends and family have commented on it. Aspirin started 07/24/20 visit due to her coronary artery calcifications. She is inquiring if she can reduce her dosing to every 2-3 days, but is agreeable to continuing on daily dosing if Dr Marlou Porch feels strongly about its benefit. Will forward to MD for review.

## 2021-01-29 NOTE — Telephone Encounter (Signed)
Patient called asking to speak with Evelyn Heath. Asked that Jinny Blossom give her a call back at [phone]

## 2021-02-01 NOTE — Telephone Encounter (Signed)
Called pt and advised her that per Dr Marlou Porch, she can try taking aspirin every other day or every 3 days. She will start with every other day and monitor for any improvement in bruising.

## 2021-03-01 DIAGNOSIS — M25551 Pain in right hip: Secondary | ICD-10-CM | POA: Diagnosis not present

## 2021-03-01 DIAGNOSIS — Z6826 Body mass index (BMI) 26.0-26.9, adult: Secondary | ICD-10-CM | POA: Diagnosis not present

## 2021-03-01 DIAGNOSIS — G5601 Carpal tunnel syndrome, right upper limb: Secondary | ICD-10-CM | POA: Diagnosis not present

## 2021-03-01 DIAGNOSIS — M8588 Other specified disorders of bone density and structure, other site: Secondary | ICD-10-CM | POA: Diagnosis not present

## 2021-03-03 ENCOUNTER — Other Ambulatory Visit: Payer: Self-pay

## 2021-03-03 ENCOUNTER — Other Ambulatory Visit: Payer: Self-pay | Admitting: Home Modifications

## 2021-03-03 ENCOUNTER — Ambulatory Visit
Admission: RE | Admit: 2021-03-03 | Discharge: 2021-03-03 | Disposition: A | Discharge status: Home / Self Care | Payer: Medicare Other | Source: Ambulatory Visit | Attending: Home Modifications | Admitting: Home Modifications

## 2021-03-03 DIAGNOSIS — K219 Gastro-esophageal reflux disease without esophagitis: Secondary | ICD-10-CM | POA: Diagnosis not present

## 2021-03-03 DIAGNOSIS — M25551 Pain in right hip: Secondary | ICD-10-CM

## 2021-03-03 DIAGNOSIS — F3341 Major depressive disorder, recurrent, in partial remission: Secondary | ICD-10-CM | POA: Diagnosis not present

## 2021-03-03 DIAGNOSIS — M4186 Other forms of scoliosis, lumbar region: Secondary | ICD-10-CM | POA: Diagnosis not present

## 2021-03-03 DIAGNOSIS — M1611 Unilateral primary osteoarthritis, right hip: Secondary | ICD-10-CM | POA: Diagnosis not present

## 2021-03-03 DIAGNOSIS — I10 Essential (primary) hypertension: Secondary | ICD-10-CM | POA: Diagnosis not present

## 2021-03-03 DIAGNOSIS — E7800 Pure hypercholesterolemia, unspecified: Secondary | ICD-10-CM | POA: Diagnosis not present

## 2021-03-03 DIAGNOSIS — M5136 Other intervertebral disc degeneration, lumbar region: Secondary | ICD-10-CM | POA: Diagnosis not present

## 2021-03-03 DIAGNOSIS — N183 Chronic kidney disease, stage 3 unspecified: Secondary | ICD-10-CM | POA: Diagnosis not present

## 2021-03-05 DIAGNOSIS — R635 Abnormal weight gain: Secondary | ICD-10-CM | POA: Diagnosis not present

## 2021-03-05 DIAGNOSIS — E669 Obesity, unspecified: Secondary | ICD-10-CM | POA: Diagnosis not present

## 2021-03-05 DIAGNOSIS — Z79899 Other long term (current) drug therapy: Secondary | ICD-10-CM | POA: Diagnosis not present

## 2021-03-05 DIAGNOSIS — E7800 Pure hypercholesterolemia, unspecified: Secondary | ICD-10-CM | POA: Diagnosis not present

## 2021-03-08 DIAGNOSIS — Z79899 Other long term (current) drug therapy: Secondary | ICD-10-CM | POA: Diagnosis not present

## 2021-03-17 ENCOUNTER — Other Ambulatory Visit: Payer: Self-pay

## 2021-03-17 ENCOUNTER — Ambulatory Visit
Admission: RE | Admit: 2021-03-17 | Discharge: 2021-03-17 | Disposition: A | Discharge status: Home / Self Care | Payer: Medicare Other | Source: Ambulatory Visit | Attending: Family Medicine | Admitting: Family Medicine

## 2021-03-17 DIAGNOSIS — Z1231 Encounter for screening mammogram for malignant neoplasm of breast: Secondary | ICD-10-CM | POA: Diagnosis not present

## 2021-04-02 DIAGNOSIS — Z1159 Encounter for screening for other viral diseases: Secondary | ICD-10-CM | POA: Diagnosis not present

## 2021-04-02 DIAGNOSIS — R7989 Other specified abnormal findings of blood chemistry: Secondary | ICD-10-CM | POA: Diagnosis not present

## 2021-04-02 DIAGNOSIS — E669 Obesity, unspecified: Secondary | ICD-10-CM | POA: Diagnosis not present

## 2021-04-02 DIAGNOSIS — R030 Elevated blood-pressure reading, without diagnosis of hypertension: Secondary | ICD-10-CM | POA: Diagnosis not present

## 2021-04-02 DIAGNOSIS — Z79899 Other long term (current) drug therapy: Secondary | ICD-10-CM | POA: Diagnosis not present

## 2021-04-02 DIAGNOSIS — E7800 Pure hypercholesterolemia, unspecified: Secondary | ICD-10-CM | POA: Diagnosis not present

## 2021-04-02 DIAGNOSIS — R635 Abnormal weight gain: Secondary | ICD-10-CM | POA: Diagnosis not present

## 2021-04-02 DIAGNOSIS — Z6826 Body mass index (BMI) 26.0-26.9, adult: Secondary | ICD-10-CM | POA: Diagnosis not present

## 2021-04-02 DIAGNOSIS — E559 Vitamin D deficiency, unspecified: Secondary | ICD-10-CM | POA: Diagnosis not present

## 2021-04-05 DIAGNOSIS — Z79899 Other long term (current) drug therapy: Secondary | ICD-10-CM | POA: Diagnosis not present

## 2021-04-07 ENCOUNTER — Other Ambulatory Visit: Payer: Self-pay

## 2021-04-07 ENCOUNTER — Ambulatory Visit
Admission: RE | Admit: 2021-04-07 | Discharge: 2021-04-07 | Disposition: A | Discharge status: Home / Self Care | Payer: Medicare Other | Source: Ambulatory Visit | Attending: Family Medicine | Admitting: Family Medicine

## 2021-04-07 DIAGNOSIS — M8589 Other specified disorders of bone density and structure, multiple sites: Secondary | ICD-10-CM | POA: Diagnosis not present

## 2021-04-07 DIAGNOSIS — Z780 Asymptomatic menopausal state: Secondary | ICD-10-CM | POA: Diagnosis not present

## 2021-04-07 DIAGNOSIS — M8580 Other specified disorders of bone density and structure, unspecified site: Secondary | ICD-10-CM

## 2021-04-15 DIAGNOSIS — M25551 Pain in right hip: Secondary | ICD-10-CM | POA: Diagnosis not present

## 2021-04-20 DIAGNOSIS — Z20822 Contact with and (suspected) exposure to covid-19: Secondary | ICD-10-CM | POA: Diagnosis not present

## 2021-04-21 DIAGNOSIS — Z23 Encounter for immunization: Secondary | ICD-10-CM | POA: Diagnosis not present

## 2021-04-30 DIAGNOSIS — Z79899 Other long term (current) drug therapy: Secondary | ICD-10-CM | POA: Diagnosis not present

## 2021-04-30 DIAGNOSIS — R635 Abnormal weight gain: Secondary | ICD-10-CM | POA: Diagnosis not present

## 2021-04-30 DIAGNOSIS — E669 Obesity, unspecified: Secondary | ICD-10-CM | POA: Diagnosis not present

## 2021-04-30 DIAGNOSIS — E7800 Pure hypercholesterolemia, unspecified: Secondary | ICD-10-CM | POA: Diagnosis not present

## 2021-04-30 DIAGNOSIS — R030 Elevated blood-pressure reading, without diagnosis of hypertension: Secondary | ICD-10-CM | POA: Diagnosis not present

## 2021-04-30 DIAGNOSIS — R7989 Other specified abnormal findings of blood chemistry: Secondary | ICD-10-CM | POA: Diagnosis not present

## 2021-05-03 DIAGNOSIS — Z79899 Other long term (current) drug therapy: Secondary | ICD-10-CM | POA: Diagnosis not present

## 2021-05-12 DIAGNOSIS — Z23 Encounter for immunization: Secondary | ICD-10-CM | POA: Diagnosis not present

## 2021-05-28 DIAGNOSIS — R7989 Other specified abnormal findings of blood chemistry: Secondary | ICD-10-CM | POA: Diagnosis not present

## 2021-05-28 DIAGNOSIS — Z6824 Body mass index (BMI) 24.0-24.9, adult: Secondary | ICD-10-CM | POA: Diagnosis not present

## 2021-05-28 DIAGNOSIS — R635 Abnormal weight gain: Secondary | ICD-10-CM | POA: Diagnosis not present

## 2021-05-28 DIAGNOSIS — N183 Chronic kidney disease, stage 3 unspecified: Secondary | ICD-10-CM | POA: Diagnosis not present

## 2021-05-28 DIAGNOSIS — I10 Essential (primary) hypertension: Secondary | ICD-10-CM | POA: Diagnosis not present

## 2021-05-28 DIAGNOSIS — I2510 Atherosclerotic heart disease of native coronary artery without angina pectoris: Secondary | ICD-10-CM | POA: Diagnosis not present

## 2021-05-28 DIAGNOSIS — K219 Gastro-esophageal reflux disease without esophagitis: Secondary | ICD-10-CM | POA: Diagnosis not present

## 2021-05-28 DIAGNOSIS — R030 Elevated blood-pressure reading, without diagnosis of hypertension: Secondary | ICD-10-CM | POA: Diagnosis not present

## 2021-05-28 DIAGNOSIS — Z79899 Other long term (current) drug therapy: Secondary | ICD-10-CM | POA: Diagnosis not present

## 2021-05-28 DIAGNOSIS — E7800 Pure hypercholesterolemia, unspecified: Secondary | ICD-10-CM | POA: Diagnosis not present

## 2021-05-28 DIAGNOSIS — F339 Major depressive disorder, recurrent, unspecified: Secondary | ICD-10-CM | POA: Diagnosis not present

## 2021-05-31 DIAGNOSIS — Z79899 Other long term (current) drug therapy: Secondary | ICD-10-CM | POA: Diagnosis not present

## 2021-06-25 DIAGNOSIS — Z6824 Body mass index (BMI) 24.0-24.9, adult: Secondary | ICD-10-CM | POA: Diagnosis not present

## 2021-06-25 DIAGNOSIS — R7989 Other specified abnormal findings of blood chemistry: Secondary | ICD-10-CM | POA: Diagnosis not present

## 2021-06-25 DIAGNOSIS — R635 Abnormal weight gain: Secondary | ICD-10-CM | POA: Diagnosis not present

## 2021-06-25 DIAGNOSIS — R030 Elevated blood-pressure reading, without diagnosis of hypertension: Secondary | ICD-10-CM | POA: Diagnosis not present

## 2021-06-25 DIAGNOSIS — E559 Vitamin D deficiency, unspecified: Secondary | ICD-10-CM | POA: Diagnosis not present

## 2021-06-25 DIAGNOSIS — Z79899 Other long term (current) drug therapy: Secondary | ICD-10-CM | POA: Diagnosis not present

## 2021-06-25 DIAGNOSIS — Z0000 Encounter for general adult medical examination without abnormal findings: Secondary | ICD-10-CM | POA: Diagnosis not present

## 2021-06-25 DIAGNOSIS — E7800 Pure hypercholesterolemia, unspecified: Secondary | ICD-10-CM | POA: Diagnosis not present

## 2021-06-29 DIAGNOSIS — Z79899 Other long term (current) drug therapy: Secondary | ICD-10-CM | POA: Diagnosis not present

## 2021-07-06 DIAGNOSIS — Z0000 Encounter for general adult medical examination without abnormal findings: Secondary | ICD-10-CM | POA: Diagnosis not present

## 2021-07-06 DIAGNOSIS — Z1389 Encounter for screening for other disorder: Secondary | ICD-10-CM | POA: Diagnosis not present

## 2021-07-06 DIAGNOSIS — Z23 Encounter for immunization: Secondary | ICD-10-CM | POA: Diagnosis not present

## 2021-07-12 DIAGNOSIS — E7800 Pure hypercholesterolemia, unspecified: Secondary | ICD-10-CM | POA: Diagnosis not present

## 2021-07-12 DIAGNOSIS — F339 Major depressive disorder, recurrent, unspecified: Secondary | ICD-10-CM | POA: Diagnosis not present

## 2021-07-12 DIAGNOSIS — F419 Anxiety disorder, unspecified: Secondary | ICD-10-CM | POA: Diagnosis not present

## 2021-07-12 DIAGNOSIS — R7303 Prediabetes: Secondary | ICD-10-CM | POA: Diagnosis not present

## 2021-07-12 DIAGNOSIS — I2510 Atherosclerotic heart disease of native coronary artery without angina pectoris: Secondary | ICD-10-CM | POA: Diagnosis not present

## 2021-07-12 DIAGNOSIS — Z6824 Body mass index (BMI) 24.0-24.9, adult: Secondary | ICD-10-CM | POA: Diagnosis not present

## 2021-07-12 DIAGNOSIS — N183 Chronic kidney disease, stage 3 unspecified: Secondary | ICD-10-CM | POA: Diagnosis not present

## 2021-07-23 DIAGNOSIS — R635 Abnormal weight gain: Secondary | ICD-10-CM | POA: Diagnosis not present

## 2021-07-23 DIAGNOSIS — R030 Elevated blood-pressure reading, without diagnosis of hypertension: Secondary | ICD-10-CM | POA: Diagnosis not present

## 2021-07-23 DIAGNOSIS — E7800 Pure hypercholesterolemia, unspecified: Secondary | ICD-10-CM | POA: Diagnosis not present

## 2021-07-23 DIAGNOSIS — Z6824 Body mass index (BMI) 24.0-24.9, adult: Secondary | ICD-10-CM | POA: Diagnosis not present

## 2021-07-23 DIAGNOSIS — Z79899 Other long term (current) drug therapy: Secondary | ICD-10-CM | POA: Diagnosis not present

## 2021-07-23 DIAGNOSIS — R7989 Other specified abnormal findings of blood chemistry: Secondary | ICD-10-CM | POA: Diagnosis not present

## 2021-07-23 DIAGNOSIS — Z1211 Encounter for screening for malignant neoplasm of colon: Secondary | ICD-10-CM | POA: Diagnosis not present

## 2021-07-28 ENCOUNTER — Other Ambulatory Visit: Payer: Self-pay

## 2021-07-28 ENCOUNTER — Ambulatory Visit (INDEPENDENT_AMBULATORY_CARE_PROVIDER_SITE_OTHER): Payer: Medicare Other | Admitting: Cardiology

## 2021-07-28 ENCOUNTER — Encounter: Payer: Self-pay | Admitting: Cardiology

## 2021-07-28 DIAGNOSIS — I6523 Occlusion and stenosis of bilateral carotid arteries: Secondary | ICD-10-CM

## 2021-07-28 DIAGNOSIS — Z789 Other specified health status: Secondary | ICD-10-CM | POA: Diagnosis not present

## 2021-07-28 DIAGNOSIS — E7849 Other hyperlipidemia: Secondary | ICD-10-CM | POA: Diagnosis not present

## 2021-07-28 DIAGNOSIS — R634 Abnormal weight loss: Secondary | ICD-10-CM

## 2021-07-28 DIAGNOSIS — I2510 Atherosclerotic heart disease of native coronary artery without angina pectoris: Secondary | ICD-10-CM | POA: Diagnosis not present

## 2021-07-28 DIAGNOSIS — I700 Atherosclerosis of aorta: Secondary | ICD-10-CM

## 2021-07-28 DIAGNOSIS — I6529 Occlusion and stenosis of unspecified carotid artery: Secondary | ICD-10-CM | POA: Insufficient documentation

## 2021-07-28 NOTE — Progress Notes (Signed)
Cardiology Office Note:    Date:  07/28/2021   ID:  Evelyn Heath, DOB 14-Nov-1945, MRN [id number]  PCP:  Aretta Nip, MD  [facility] Cardiologist:  Candee Furbish, MD  [facility] Electrophysiologist:  None   Referring MD: Aretta Nip, MD   History of Present Illness:    Evelyn Heath is a 76 y.o. female here for the follow-up of coronary artery disease, statin intolerant, familial hyperlipidemia.  Hyperlipidemia on PCSK9, Praluent, coronary calcification seen on CT scan.  Has battled with knee osteoarthritis at times but overall doing very well with her Praluent without any significant side effects.  Previous: LDL 237, non-HDL 266, HDL 66, triglycerides 145, total cholesterol 332 on 05/25/16 performed at [facility] with Dr. Drema Dallas. Unable to tolerate statins, Zetia, WelChol. She is working with [employer] and plans to start Nutrisystem. Anxiety, Prozac. Retired [employer]. Hemoglobin A1c 5.9. Creatinine 1.1, vitamin D 37, normal. ALT 15.   Lipitor - left 4-5 digits woke up numb Crestor - cramps Welchol - numbness in legs Zetia - leg numbness.    No early FHX of CAD. Paternal grandfather died CHF.  However her sister died in her 20s with comorbidity of atrial fibrillation, she was obese.   No diabetes, no current smoking, quit in 2013-just stopped.   Tried statins first in the 1990's.   She remained hesitant to increase Praluent dose from 75 mg to 150 mg to target LDL less than 70.   Overall LDL from 237-99.  At her last appointment she seemed to be doing quite well.  Wishes that she could swim but Covid has shutdown many pools.  Today: Overall, she is feeling fine. Her LDL on 07/12/21 was 112 on praluent.  For activity she is participating in a weight clinic at [facility] in [facility] and has successfully lost 15 lbs. She has also been taking phentermine, and reports having no side effects.   She denies any palpitations, chest pain, or shortness of  breath. No lightheadedness, headaches, syncope, orthopnea, PND, lower extremity edema or exertional symptoms.  She recently visited [place] with her family.  Past Medical History:  Diagnosis Date   Anxiety    Borderline osteopenia    Elevated cholesterol with elevated triglycerides    History of shingles 10/07; 1/08; 2/08   per patient   Hyperlipidemia    Hypertension    Insomnia, persistent    Menopausal state 2000   no HRT   Osteoporosis    Vitamin deficiency     Past Surgical History:  Procedure Laterality Date   ABDOMINAL EXPLORATION SURGERY  1981/ 1982   following ectopic pregnancy   KNEE ARTHROSCOPY W/ ACL RECONSTRUCTION     left   KNEE SURGERY     left    Current Medications: Current Meds  Medication Sig   ALPRAZolam (XANAX) 0.25 MG tablet Take 0.25 mg by mouth at bedtime as needed for anxiety or sleep.    aspirin EC 81 MG tablet Take 81 mg by mouth every other day. Bruising on daily dosing   B Complex Vitamins (VITAMIN B COMPLEX PO) TAKE 1 TABLET BY MOUTH EVERY 2-3 WEEKS   cholecalciferol (VITAMIN D) 1000 UNITS tablet Take 2,000 Units by mouth daily.   FLUoxetine (PROZAC) 20 MG capsule Take 20 mg by mouth every morning.    Melatonin 1 MG CAPS Take 3 mg by mouth at bedtime.    omeprazole (PRILOSEC) 20 MG capsule Take 20 mg by mouth 2 (  two) times a week.   phentermine 37.5 MG capsule Take 37.5 mg by mouth every morning.   PRALUENT 150 MG/ML SOAJ INJECT THE CONTENTS OF 1 PEN INTO THE SKIN EVERY 14 DAYS     Allergies:   Bactrim [sulfamethoxazole-trimethoprim], Lipitor [atorvastatin], Meloxicam, Statins, Terramycin [oxytetracycline], Codeine, Crestor [rosuvastatin], and Tetracyclines & related   Social History   Socioeconomic History   Marital status: Married    Spouse name: Jermaine Tholl   Number of children: 1   Years of education: Not on file   Highest education level: Not on file  Occupational History   Occupation: Charity fundraiser for [employer]   Occupation: retired  Tobacco Use   Smoking status: Former    Packs/day: 0.50    Types: Cigarettes   Smokeless tobacco: Never  Substance and Sexual Activity   Alcohol use: Yes    Comment: rare wine   Drug use: No   Sexual activity: Yes    Partners: Male    Birth control/protection: Post-menopausal  Other Topics Concern   Not on file  Social History Narrative   Not on file   Social Determinants of Health   Financial Resource Strain: Not on file  Food Insecurity: Not on file  Transportation Needs: Not on file  Physical Activity: Not on file  Stress: Not on file  Social Connections: Not on file     Family History: The patient's family history includes Breast cancer in her maternal grandmother; Cancer in her maternal grandmother and mother; Cancer (age of onset: 74) in her father; Diabetes (age of onset: 65) in her father. There is no history of Angioedema, Allergic rhinitis, Asthma, Eczema, Immunodeficiency, or Urticaria.  ROS:   Please see the history of present illness.    All other systems reviewed and are negative.  EKGs/Labs/Other Studies Reviewed:    Bilateral Carotid Duplex 08/10/2018: Summary:  Right Carotid: Velocities in the right ICA are consistent with a 1-39%  stenosis.   Left Carotid: Velocities in the left ICA are consistent with a 1-39%  stenosis.   Vertebrals:  Right vertebral artery demonstrates antegrade flow. Left  vertebral artery demonstrates bidirectional flow.  Subclavians: Left subclavian artery was stenotic. Normal flow hemodynamics  were seen in the right subclavian artery.   Calcium Score 07/12/2016: FINDINGS: Non-cardiac: See separate report from [facility].   Ascending Aorta:  Normal size.  Mild diffuse calcifications.   Pericardium: Normal.   Coronary arteries:  Normal origin.   IMPRESSION: Coronary calcium score of 128. This was 36 percentile for age and sex matched control.  ADDENDUM REPORT: 07/12/2016  15:50 ADDENDUM: Mediastinum: The visualized portions of the trachea and esophagus are unremarkable. The no mediastinal or hilar adenopathy identified.   Lungs/pleura: Mild changes of centrilobular emphysema. Diffuse bronchial wall thickening noted. Subpleural nodule in the left lower lobe measures 4 mm, image 37 of series 3.   Upper abdomen:  No acute abnormality.   Musculoskeletal: No chest wall mass or suspicious bone abnormalities.  EKG:   EKG is personally reviewed and interpreted. 07/28/2021: Sinus rhythm. Rate 69 bpm. No changes from prior. 07/23/2020: sinus rhythm 60 07/18/2019: sinus rhythm 64 with no other significant abnormalities 07/09/2018: sinus rhythm no other abnormalities  Recent Labs: No results found for requested labs within last 8760 hours.   Recent Lipid Panel    Component Value Date/Time   CHOL 206 (H) 07/18/2019 0850   TRIG 138 07/18/2019 0850   HDL 71 07/18/2019 0850   CHOLHDL 2.9  07/18/2019 0850   LDLCALC 111 (H) 07/18/2019 0850     Risk Assessment/Calculations:       Physical Exam:    VS:  BP 126/80 (BP Location: Left Arm, Patient Position: Sitting, Cuff Size: Normal)    Pulse 69    Ht 5\' 7"  (1.702 m)    Wt 163 lb (73.9 kg)    LMP  (LMP Unknown)    SpO2 97%    BMI 25.53 kg/m     Wt Readings from Last 3 Encounters:  07/28/21 163 lb (73.9 kg)  07/23/20 200 lb (90.7 kg)  07/18/19 202 lb 9.6 oz (91.9 kg)     GEN:  Well nourished, well developed in no acute distress HEENT: Normal NECK: No JVD; No carotid bruits LYMPHATICS: No lymphadenopathy CARDIAC: RRR, no murmurs, rubs, gallops RESPIRATORY:  Clear to auscultation without rales, wheezing or rhonchi  ABDOMEN: Soft, non-tender, non-distended MUSCULOSKELETAL:  No edema; No deformity  SKIN: Warm and dry NEUROLOGIC:  Alert and oriented x 3 PSYCHIATRIC:  Normal affect   ASSESSMENT:    1. Familial hyperlipidemia   2. Atherosclerosis of native coronary artery of native heart without angina  pectoris   3. Statin intolerance   4. Bilateral carotid artery stenosis   5. Aortic atherosclerosis ([place])   6. Weight loss     PLAN:    In order of problems listed above:  Familial hyperlipidemia -Baseline LDL was 237.    Most recent LDL 112 on PCSK9 inhibitor.  Had problems with statins in the past.  Now taking Praluent, doing very well with the assistance of our lipid clinic.  No myalgias.  Continue with current medical management.  CAD (coronary atherosclerotic disease) Aggress risk factor modification including Praluent.Prior CT scan showed coronary artery calcification, continue with Sherlynn Stalls prior visit to initiate low-dose aspirin.  Hemoglobin 14.7 creatinine 1.04  Statin intolerance Had difficulty with prior statin use.  Doing very well on PCSK9 inhibitor.  Carotid stenosis Very mild previous Doppler reviewed from 2020.  Continue with lipid-lowering therapy.  Aortic atherosclerosis ([place]) Continue with PCSK9 inhibitor.  Aspirin 81 mg.  Weight loss Lost approximately 50 pounds.  Currently taking phentermine.  No adverse arrhythmias noted.  EKG stable.  No chest pain or anginal symptoms.  She is almost at goal weight.  When she is there, they will taper this off.  Excellent.  I am comfortable with her utilizing this medication at this time.   Follow-up:   1 year   Medication Adjustments/Labs and Tests Ordered: Current medicines are reviewed at length with the patient today.  Concerns regarding medicines are outlined above.   Orders Placed This Encounter  Procedures   EKG 12-Lead   No orders of the defined types were placed in this encounter.  Patient Instructions  Medication Instructions:  No changes *If you need a refill on your cardiac medications before your next appointment, please call your pharmacy*   Lab Work: none  Testing/Procedures: none   Follow-Up: At [facility], you and your health needs are our priority.  As part of our continuing mission  to provide you with exceptional heart care, we have created designated Provider Care Teams.  These Care Teams include your primary Cardiologist (physician) and Advanced Practice Providers (APPs -  Physician Assistants and Nurse Practitioners) who all work together to provide you with the care you need, when you need it.   Your next appointment:   12 month(s)  The format for your next appointment:   In Person  Provider:   Candee Furbish, MD     Other Instructions     I,Mathew Stumpf,acting as a scribe for Candee Furbish, MD.,have documented all relevant documentation on the behalf of Candee Furbish, MD,as directed by  Candee Furbish, MD while in the presence of Candee Furbish, MD.  I, Candee Furbish, MD, have reviewed all documentation for this visit. The documentation on 07/28/21 for the exam, diagnosis, procedures, and orders are all accurate and complete.   Signed, Candee Furbish, MD  07/28/2021 10:15 AM    [facility]

## 2021-07-28 NOTE — Assessment & Plan Note (Signed)
Lost approximately 50 pounds.  Currently taking phentermine.  No adverse arrhythmias noted.  EKG stable.  No chest pain or anginal symptoms.  She is almost at goal weight.  When she is there, they will taper this off.  Excellent.  I am comfortable with her utilizing this medication at this time.

## 2021-07-28 NOTE — Assessment & Plan Note (Signed)
Had difficulty with prior statin use.  Doing very well on PCSK9 inhibitor.

## 2021-07-28 NOTE — Assessment & Plan Note (Addendum)
-  Baseline LDL was 237.    Most recent LDL 112 on PCSK9 inhibitor.  Had problems with statins in the past.  Now taking Praluent, doing very well with the assistance of our lipid clinic.  No myalgias.  Continue with current medical management.

## 2021-07-28 NOTE — Assessment & Plan Note (Signed)
Aggress risk factor modification including Praluent.Prior CT scan showed coronary artery calcification, continue with Sherlynn Stalls prior visit to initiate low-dose aspirin.  Hemoglobin 14.7 creatinine 1.04

## 2021-07-28 NOTE — Assessment & Plan Note (Signed)
Continue with PCSK9 inhibitor.  Aspirin 81 mg.

## 2021-07-28 NOTE — Patient Instructions (Signed)
Medication Instructions:  No changes *If you need a refill on your cardiac medications before your next appointment, please call your pharmacy*   Lab Work: none  Testing/Procedures: none   Follow-Up: At [facility], you and your health needs are our priority.  As part of our continuing mission to provide you with exceptional heart care, we have created designated Provider Care Teams.  These Care Teams include your primary Cardiologist (physician) and Advanced Practice Providers (APPs -  Physician Assistants and Nurse Practitioners) who all work together to provide you with the care you need, when you need it.   Your next appointment:   12 month(s)  The format for your next appointment:   In Person  Provider:   Candee Furbish, MD     Other Instructions

## 2021-07-28 NOTE — Assessment & Plan Note (Signed)
Very mild previous Doppler reviewed from 2020.  Continue with lipid-lowering therapy.

## 2021-08-13 ENCOUNTER — Other Ambulatory Visit: Payer: Self-pay | Admitting: Cardiology

## 2021-08-13 DIAGNOSIS — I2510 Atherosclerotic heart disease of native coronary artery without angina pectoris: Secondary | ICD-10-CM

## 2021-08-13 DIAGNOSIS — E7849 Other hyperlipidemia: Secondary | ICD-10-CM

## 2021-12-08 IMAGING — MG MM DIGITAL SCREENING BILAT W/ TOMO AND CAD
8 series · 8 of 24 positions shown · non-contrast
Comparison: Previous exam(s).

ACR Breast Density Category a: The breast tissue is almost entirely
fatty.

CLINICAL DATA: Screening.

EXAM:
DIGITAL SCREENING BILATERAL MAMMOGRAM WITH TOMOSYNTHESIS AND CAD
TECHNIQUE: Bilateral screening digital craniocaudal and mediolateral oblique
mammograms were obtained. Bilateral screening digital breast
tomosynthesis was performed. The images were evaluated with
computer-aided detection.

[L MLO synth-2D]
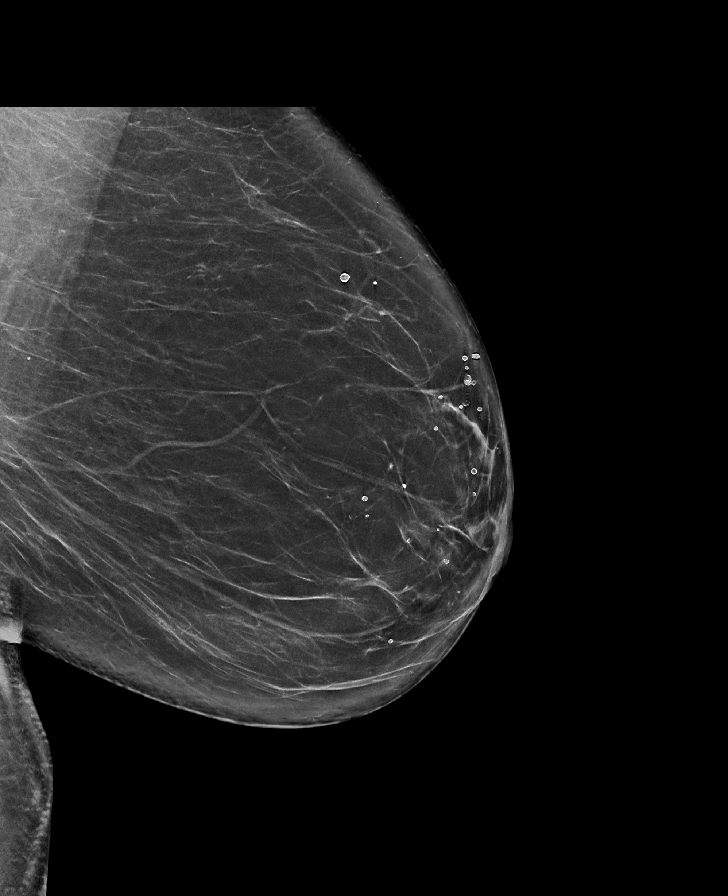

[R CC synth-2D]
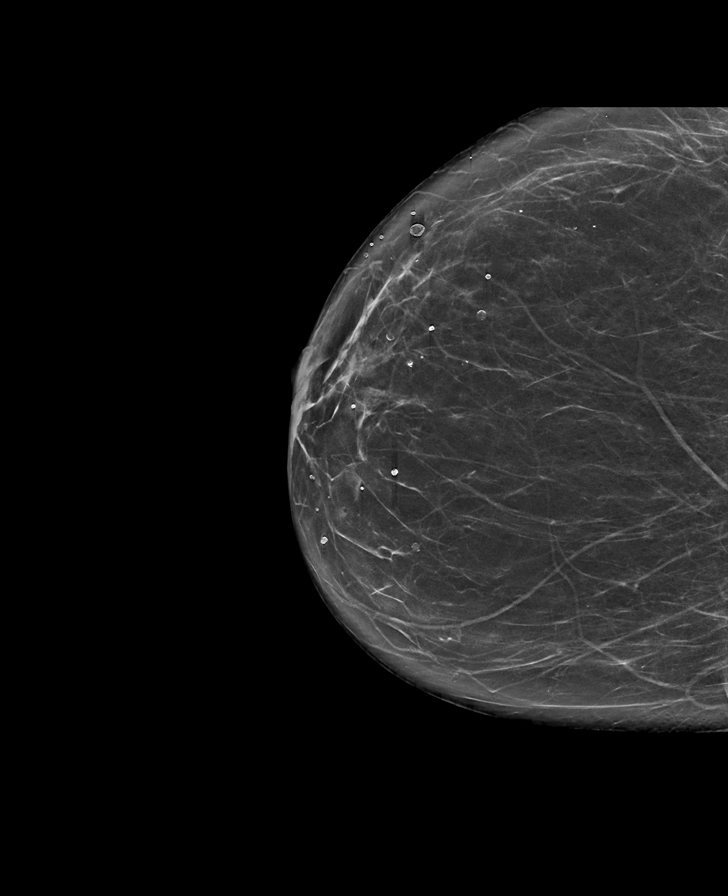

[R MLO synth-2D]
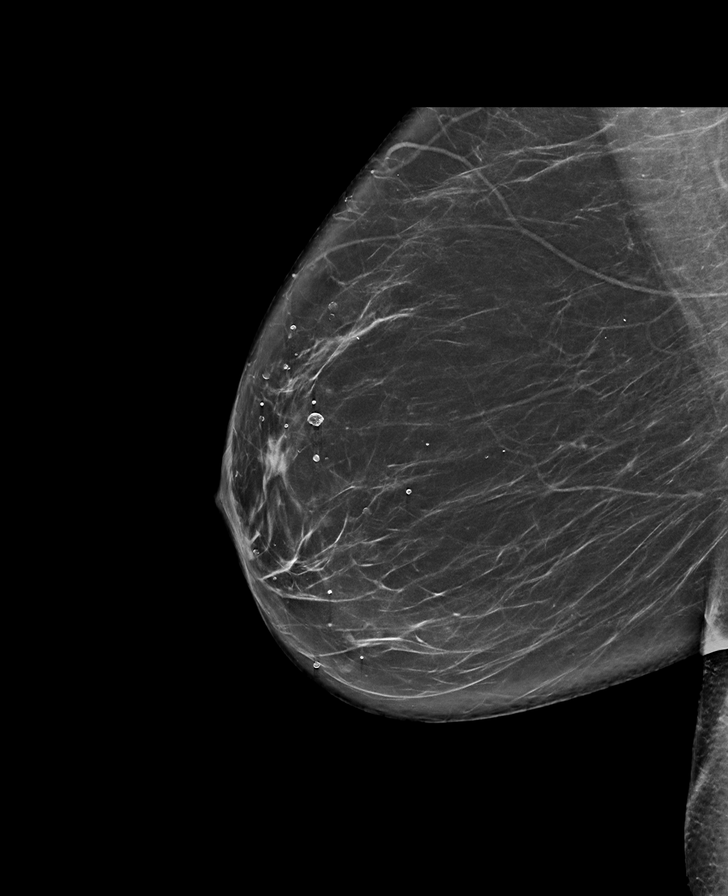

[L CC synth-2D]
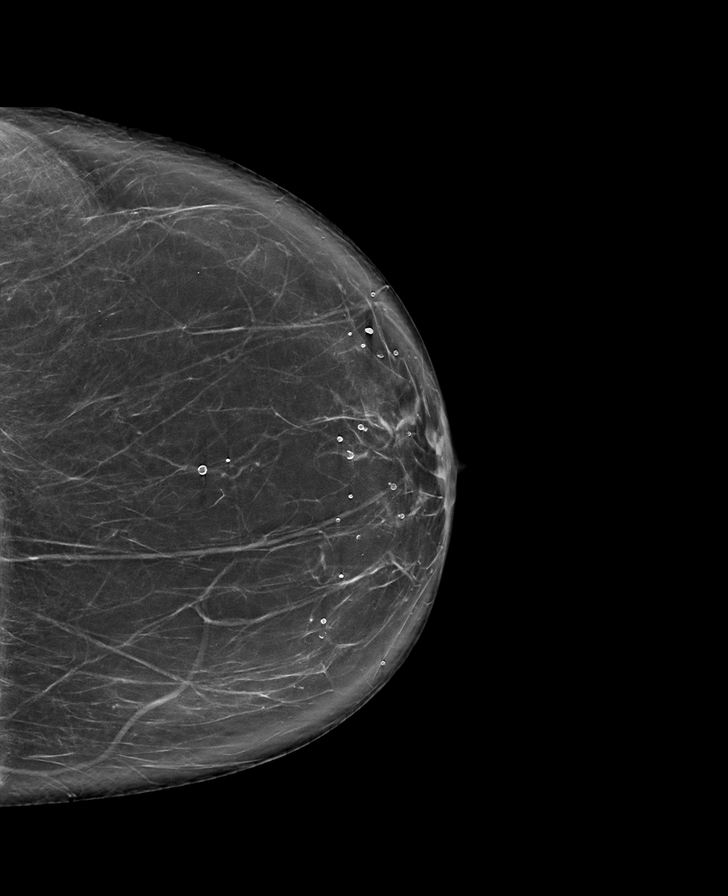

[L MLO tomo · tomo slice 41/82.0]
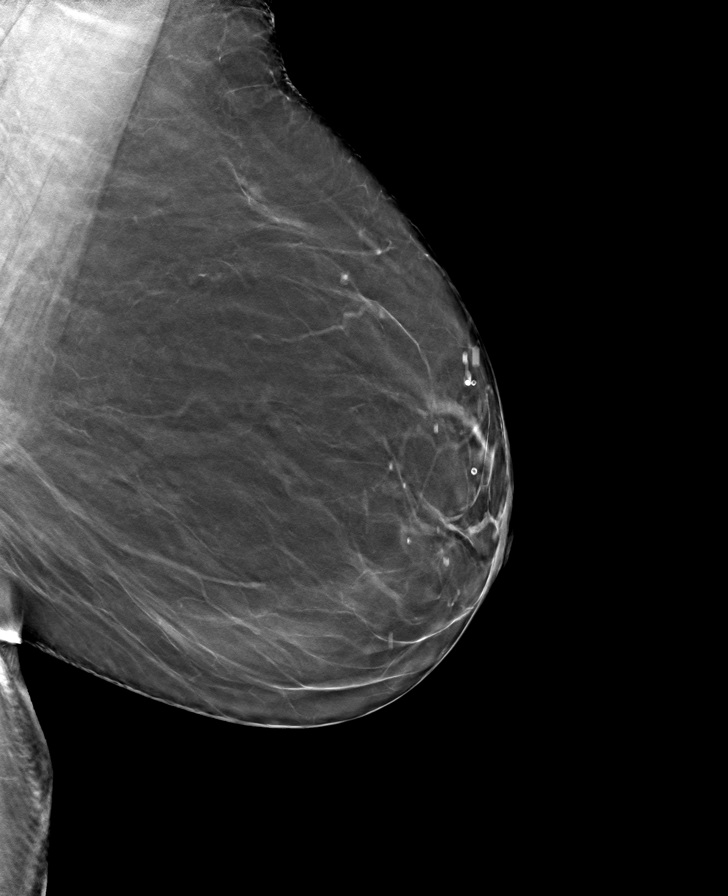

[R CC tomo · tomo slice 37/74.0]
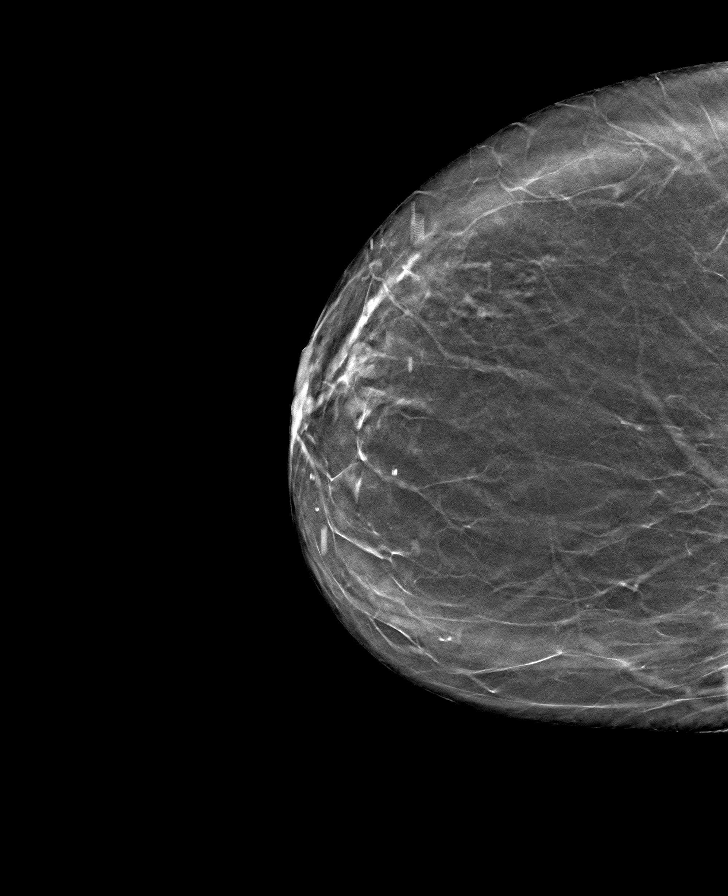

[R MLO tomo · tomo slice 42/83.0]
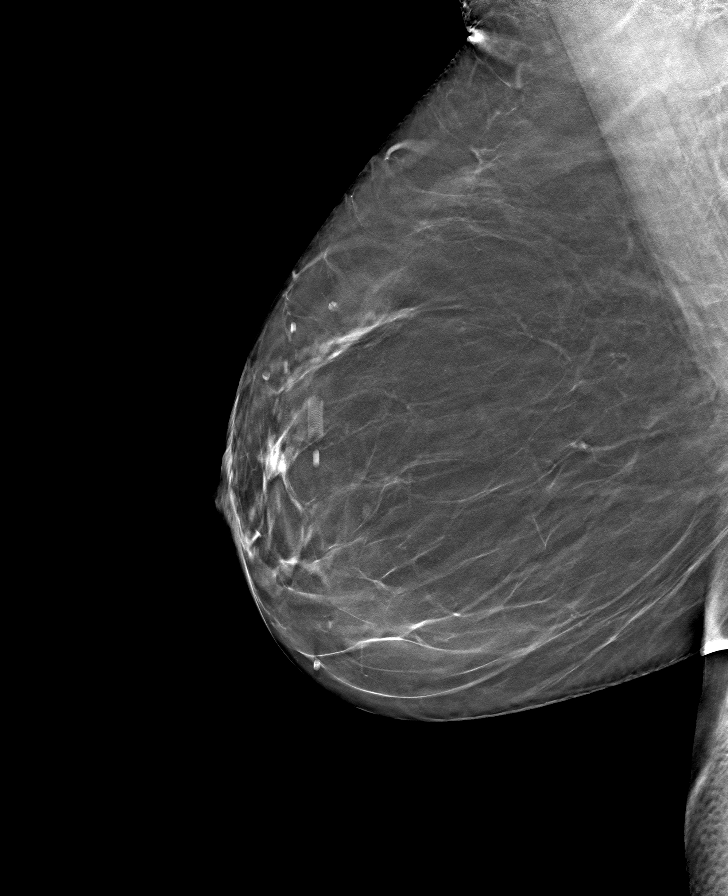

[L CC tomo · tomo slice 42/83.0]
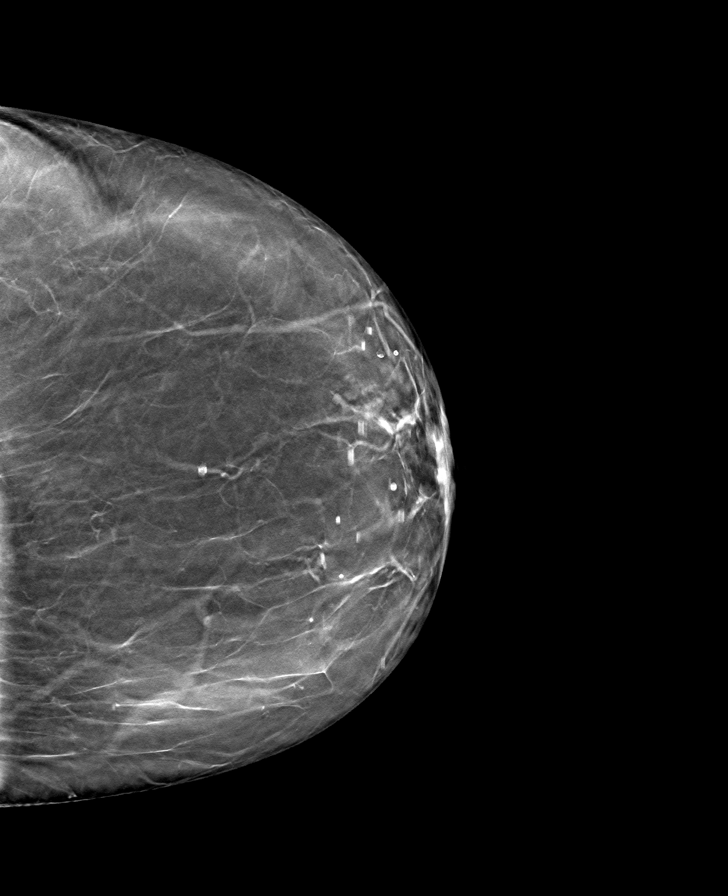

[8 of 24 positions shown; findings below may reference images not displayed]

FINDINGS: There are no findings suspicious for malignancy.
IMPRESSION: No mammographic evidence of malignancy. A result letter of this
screening mammogram will be mailed directly to the patient.

RECOMMENDATION:
Screening mammogram in one year. (Code:0E-3-N98)

BI-RADS CATEGORY  1: Negative.

## 2022-02-21 ENCOUNTER — Other Ambulatory Visit: Payer: Self-pay | Admitting: Family Medicine

## 2022-02-21 DIAGNOSIS — Z1231 Encounter for screening mammogram for malignant neoplasm of breast: Secondary | ICD-10-CM

## 2022-04-19 ENCOUNTER — Ambulatory Visit: Payer: Medicare Other

## 2022-05-11 ENCOUNTER — Ambulatory Visit: Payer: Medicare Other

## 2022-06-08 ENCOUNTER — Ambulatory Visit
Admission: RE | Admit: 2022-06-08 | Discharge: 2022-06-08 | Disposition: A | Discharge status: Home / Self Care | Payer: Medicare Other | Source: Ambulatory Visit | Attending: Family Medicine | Admitting: Family Medicine

## 2022-06-08 DIAGNOSIS — Z1231 Encounter for screening mammogram for malignant neoplasm of breast: Secondary | ICD-10-CM

## 2022-07-27 ENCOUNTER — Other Ambulatory Visit: Payer: Self-pay | Admitting: Family Medicine

## 2022-07-27 DIAGNOSIS — R413 Other amnesia: Secondary | ICD-10-CM

## 2022-08-02 ENCOUNTER — Other Ambulatory Visit (HOSPITAL_COMMUNITY): Payer: Self-pay | Admitting: Family Medicine

## 2022-08-02 ENCOUNTER — Ambulatory Visit (HOSPITAL_COMMUNITY): Admission: RE | Admit: 2022-08-02 | Payer: Medicare Other | Source: Ambulatory Visit

## 2022-08-02 DIAGNOSIS — R0989 Other specified symptoms and signs involving the circulatory and respiratory systems: Secondary | ICD-10-CM

## 2022-08-09 ENCOUNTER — Ambulatory Visit (HOSPITAL_COMMUNITY)
Admission: RE | Admit: 2022-08-09 | Discharge: 2022-08-09 | Disposition: A | Discharge status: Home / Self Care | Payer: Medicare Other | Source: Ambulatory Visit | Attending: Family Medicine | Admitting: Family Medicine

## 2022-08-09 DIAGNOSIS — R0989 Other specified symptoms and signs involving the circulatory and respiratory systems: Secondary | ICD-10-CM

## 2022-08-23 ENCOUNTER — Ambulatory Visit
Admission: RE | Admit: 2022-08-23 | Discharge: 2022-08-23 | Disposition: A | Discharge status: Home / Self Care | Payer: Medicare Other | Source: Ambulatory Visit | Attending: Family Medicine | Admitting: Family Medicine

## 2022-08-23 DIAGNOSIS — R413 Other amnesia: Secondary | ICD-10-CM

## 2022-09-02 ENCOUNTER — Encounter: Payer: Self-pay | Admitting: Cardiology

## 2022-09-02 ENCOUNTER — Ambulatory Visit: Payer: Medicare Other | Attending: Cardiology | Admitting: Cardiology

## 2022-09-02 VITALS — BP 146/88 | HR 56 | Ht 67.0 in | Wt 174.0 lb

## 2022-09-02 DIAGNOSIS — I771 Stricture of artery: Secondary | ICD-10-CM | POA: Diagnosis present

## 2022-09-02 DIAGNOSIS — E7849 Other hyperlipidemia: Secondary | ICD-10-CM | POA: Insufficient documentation

## 2022-09-02 DIAGNOSIS — I700 Atherosclerosis of aorta: Secondary | ICD-10-CM | POA: Insufficient documentation

## 2022-09-02 DIAGNOSIS — I2510 Atherosclerotic heart disease of native coronary artery without angina pectoris: Secondary | ICD-10-CM | POA: Insufficient documentation

## 2022-09-02 NOTE — Progress Notes (Signed)
Cardiology Office Note:    Date:  09/02/2022   ID:  Evelyn Heath, DOB 06/19/1946, MRN [id number]  PCP:  Evelyn Congress, NP  [facility] Cardiologist:  Evelyn Furbish, MD  [facility] Electrophysiologist:  None   Referring MD: Evelyn Congress, NP   History of Present Illness:    Evelyn Heath is a 77 y.o. female here for the follow-up of coronary artery disease, statin intolerant, familial hyperlipidemia.  Hyperlipidemia on PCSK9, Praluent, coronary calcification seen on CT scan.  Has battled with knee osteoarthritis at times but overall doing very well with her Praluent without any significant side effects.  Previous: LDL 237, non-HDL 266, HDL 66, triglycerides 145, total cholesterol 332 on 05/25/16 performed at [facility] with Dr. Drema Heath. Unable to tolerate statins, Zetia, WelChol. She is working with [employer] and plans to start [employer]. Anxiety, Prozac. Retired [employer]. Hemoglobin A1c 5.9. Creatinine 1.1, vitamin D 37, normal. ALT 15.   Lipitor - left 4-5 digits woke up numb Crestor - cramps Welchol - numbness in legs Zetia - leg numbness.    No early FHX of CAD. Paternal grandfather died CHF.  However her sister died in her 86s with comorbidity of atrial fibrillation, she was obese.   No diabetes, no current smoking, quit in 2013-just stopped.   Tried statins first in the 1990's.   She remained hesitant to increase Praluent dose from 75 mg to 150 mg to target LDL less than 70.   Overall LDL from 237-99.  Overall been doing quite well with the PCSK9 inhibitor.  Her LDL did increase slightly prior check.  She is compliant she states.  Continue.  No chest pain fevers chills nausea vomiting.  She had a carotid Doppler which did demonstrate some left subclavian stenosis  Previously visited [place] with her family.  Past Medical History:  Diagnosis Date   Anxiety    Borderline osteopenia    Elevated cholesterol with elevated triglycerides     History of shingles 10/07; 1/08; 2/08   per patient   Hyperlipidemia    Hypertension    Insomnia, persistent    Menopausal state 2000   no HRT   Osteoporosis    Vitamin deficiency     Past Surgical History:  Procedure Laterality Date   ABDOMINAL EXPLORATION SURGERY  1981/ 1982   following ectopic pregnancy   KNEE ARTHROSCOPY W/ ACL RECONSTRUCTION     left   KNEE SURGERY     left    Current Medications: Current Meds  Medication Sig   Alirocumab (PRALUENT) 150 MG/ML SOAJ INJECT THE CONTENTS OF ONE PEN INTO THE SKIN EVERY 14 DAYS   ALPRAZolam (XANAX) 0.25 MG tablet Take 0.25 mg by mouth at bedtime as needed for anxiety or sleep.    aspirin EC 81 MG tablet Take 81 mg by mouth every other day. Bruising on daily dosing   B Complex Vitamins (VITAMIN B COMPLEX PO) TAKE 1 TABLET BY MOUTH EVERY 2-3 WEEKS   cholecalciferol (VITAMIN D) 1000 UNITS tablet Take 2,000 Units by mouth daily.   FLUoxetine (PROZAC) 20 MG capsule Take 20 mg by mouth every morning.    Melatonin 1 MG CAPS Take 3 mg by mouth at bedtime.    omeprazole (PRILOSEC) 20 MG capsule Take 20 mg by mouth 2 (two) times a week.   phentermine 37.5 MG capsule Take 37.5 mg by mouth every morning.     Allergies:   Bactrim [sulfamethoxazole-trimethoprim], Lipitor [atorvastatin], Meloxicam, Statins, Terramycin [oxytetracycline], Codeine,  Crestor [rosuvastatin], and Tetracyclines & related   Social History   Socioeconomic History   Marital status: Married    Spouse name: Moukthika Griffieth   Number of children: 1   Years of education: Not on file   Highest education level: Not on file  Occupational History   Occupation: Charity fundraiser for [employer]   Occupation: retired  Tobacco Use   Smoking status: Former    Packs/day: 0.50    Types: Cigarettes   Smokeless tobacco: Never  Substance and Sexual Activity   Alcohol use: Yes    Comment: rare wine   Drug use: No   Sexual activity: Yes    Partners: Male    Birth  control/protection: Post-menopausal  Other Topics Concern   Not on file  Social History Narrative   Not on file   Social Determinants of Health   Financial Resource Strain: Not on file  Food Insecurity: Not on file  Transportation Needs: Not on file  Physical Activity: Not on file  Stress: Not on file  Social Connections: Not on file     Family History: The patient's family history includes Breast cancer in her maternal grandmother; Cancer in her maternal grandmother and mother; Cancer (age of onset: 33) in her father; Diabetes (age of onset: 68) in her father. There is no history of Angioedema, Allergic rhinitis, Asthma, Eczema, Immunodeficiency, or Urticaria.  ROS:   Please see the history of present illness.    All other systems reviewed and are negative.  EKGs/Labs/Other Studies Reviewed:    Bilateral Carotid Duplex 08/10/2018: Summary:  Right Carotid: Velocities in the right ICA are consistent with a 1-39%  stenosis.   Left Carotid: Velocities in the left ICA are consistent with a 1-39%  stenosis.   Vertebrals:  Right vertebral artery demonstrates antegrade flow. Left  vertebral artery demonstrates bidirectional flow.  Subclavians: Left subclavian artery was stenotic. Normal flow hemodynamics  were seen in the right subclavian artery.   Calcium Score 07/12/2016: FINDINGS: Non-cardiac: See separate report from [facility].   Ascending Aorta:  Normal size.  Mild diffuse calcifications.   Pericardium: Normal.   Coronary arteries:  Normal origin.   IMPRESSION: Coronary calcium score of 128. This was 91 percentile for age and sex matched control.  ADDENDUM REPORT: 07/12/2016 15:50 ADDENDUM: Mediastinum: The visualized portions of the trachea and esophagus are unremarkable. The no mediastinal or hilar adenopathy identified.   Lungs/pleura: Mild changes of centrilobular emphysema. Diffuse bronchial wall thickening noted. Subpleural nodule in the left  lower lobe measures 4 mm, image 37 of series 3.   Upper abdomen:  No acute abnormality.   Musculoskeletal: No chest wall mass or suspicious bone abnormalities.  EKG:   EKG is personally reviewed and interpreted. 09/02/2022-sinus bradycardia 56 Q waves in V1 and V2 07/28/2021: Sinus rhythm. Rate 69 bpm. No changes from prior. 07/23/2020: sinus rhythm 60 07/18/2019: sinus rhythm 64 with no other significant abnormalities 07/09/2018: sinus rhythm no other abnormalities  Recent Labs: No results found for requested labs within last 365 days.   Recent Lipid Panel    Component Value Date/Time   CHOL 206 (H) 07/18/2019 0850   TRIG 138 07/18/2019 0850   HDL 71 07/18/2019 0850   CHOLHDL 2.9 07/18/2019 0850   LDLCALC 111 (H) 07/18/2019 0850     Risk Assessment/Calculations:       Physical Exam:    VS:  BP (!) 146/88   Pulse (!) 56   Ht 5' 7"$  (1.702  m)   Wt 174 lb (78.9 kg)   LMP  (LMP Unknown)   SpO2 96%   BMI 27.25 kg/m     Wt Readings from Last 3 Encounters:  09/02/22 174 lb (78.9 kg)  07/28/21 163 lb (73.9 kg)  07/23/20 200 lb (90.7 kg)     GEN:  Well nourished, well developed in no acute distress HEENT: Normal NECK: No JVD; Left carotid/subclavian bruits LYMPHATICS: No lymphadenopathy CARDIAC: RRR, no murmurs, rubs, gallops RESPIRATORY:  Clear to auscultation without rales, wheezing or rhonchi  ABDOMEN: Soft, non-tender, non-distended MUSCULOSKELETAL:  No edema; No deformity  SKIN: Warm and dry NEUROLOGIC:  Alert and oriented x 3 PSYCHIATRIC:  Normal affect   ASSESSMENT:    1. Atherosclerosis of native coronary artery of native heart without angina pectoris   2. Aortic atherosclerosis ([place])   3. Familial hyperlipidemia   4. Subclavian artery stenosis, left (HCC)      PLAN:    In order of problems listed above:   Familial hyperlipidemia -Baseline LDL was 237.    Prior LDL 112 on PCSK9 inhibitor.  Had increased to the 160 range.  Continue with current  regimen.  Had problems with statins in the past.  Now taking Praluent, doing very well with the assistance of our lipid clinic.  No myalgias.  Continue with current medical management.  CAD (coronary atherosclerotic disease) Aggress risk factor modification including Praluent.Prior CT scan showed coronary artery calcification, continue initiate low-dose aspirin.  Hemoglobin 14.7 creatinine 1.04  Statin intolerance Had difficulty with prior statin use.  Doing very well on PCSK9 inhibitor.  Maintains compliance.  Carotid stenosis Very mild previous Doppler reviewed from 2020.  Continue with lipid-lowering therapy.  No changes  Aortic atherosclerosis (HCC) Continue with PCSK9 inhibitor.  Aspirin 81 mg.  No changes  Subclavian stenosis left Murmur heard on exam.  Excellent ulnar pulse.  No symptoms.  She does have bilateral numbness at times in her fingers which is more likely carpal tunnel.  Should be of no clinical consequence at this point.  Weight loss Lost approximately 50 pounds.  Was previously taking phentermine.  No adverse arrhythmias noted.  EKG stable.  No chest pain or anginal symptoms.    Follow-up:   1 year   Medication Adjustments/Labs and Tests Ordered: Current medicines are reviewed at length with the patient today.  Concerns regarding medicines are outlined above.   Orders Placed This Encounter  Procedures   EKG 12-Lead   No orders of the defined types were placed in this encounter.  Patient Instructions  Medication Instructions:  Your physician recommends that you continue on your current medications as directed. Please refer to the Current Medication list given to you today.  *If you need a refill on your cardiac medications before your next appointment, please call your pharmacy*   Lab Work: None.  If you have labs (blood work) drawn today and your tests are completely normal, you will receive your results only by: [place] (if you have MyChart)  OR A paper copy in the mail If you have any lab test that is abnormal or we need to change your treatment, we will call you to review the results.   Testing/Procedures: None.   Follow-Up: At [facility], you and your health needs are our priority.  As part of our continuing mission to provide you with exceptional heart care, we have created designated Provider Care Teams.  These Care Teams include your primary Cardiologist (physician) and Advanced  Practice Providers (APPs -  Physician Assistants and Nurse Practitioners) who all work together to provide you with the care you need, when you need it.  We recommend signing up for the patient portal called "MyChart".  Sign up information is provided on this After Visit Summary.  MyChart is used to connect with patients for Virtual Visits (Telemedicine).  Patients are able to view lab/test results, encounter notes, upcoming appointments, etc.  Non-urgent messages can be sent to your provider as well.   To learn more about what you can do with MyChart, go to [email or web address].    Your next appointment:   1 year(s)  Provider:   Candee Furbish, MD        Signed, Evelyn Furbish, MD  09/02/2022 10:22 AM    [place]

## 2022-09-02 NOTE — Patient Instructions (Signed)
Medication Instructions:  Your physician recommends that you continue on your current medications as directed. Please refer to the Current Medication list given to you today.  *If you need a refill on your cardiac medications before your next appointment, please call your pharmacy*   Lab Work: None.  If you have labs (blood work) drawn today and your tests are completely normal, you will receive your results only by: [place] (if you have MyChart) OR A paper copy in the mail If you have any lab test that is abnormal or we need to change your treatment, we will call you to review the results.   Testing/Procedures: None.   Follow-Up: At [facility], you and your health needs are our priority.  As part of our continuing mission to provide you with exceptional heart care, we have created designated Provider Care Teams.  These Care Teams include your primary Cardiologist (physician) and Advanced Practice Providers (APPs -  Physician Assistants and Nurse Practitioners) who all work together to provide you with the care you need, when you need it.  We recommend signing up for the patient portal called "MyChart".  Sign up information is provided on this After Visit Summary.  MyChart is used to connect with patients for Virtual Visits (Telemedicine).  Patients are able to view lab/test results, encounter notes, upcoming appointments, etc.  Non-urgent messages can be sent to your provider as well.   To learn more about what you can do with MyChart, go to [email or web address].    Your next appointment:   1 year(s)  Provider:   Candee Furbish, MD

## 2022-09-07 ENCOUNTER — Other Ambulatory Visit: Payer: Self-pay | Admitting: Cardiology

## 2022-09-07 DIAGNOSIS — I2510 Atherosclerotic heart disease of native coronary artery without angina pectoris: Secondary | ICD-10-CM

## 2022-09-07 DIAGNOSIS — E7849 Other hyperlipidemia: Secondary | ICD-10-CM

## 2022-09-21 ENCOUNTER — Telehealth: Payer: Self-pay | Admitting: Pharmacist

## 2022-09-21 NOTE — Telephone Encounter (Signed)
Received letter in mail pt got temporary supply. Praluent needs PA

## 2022-09-23 ENCOUNTER — Other Ambulatory Visit (HOSPITAL_COMMUNITY): Payer: Self-pay

## 2022-09-23 ENCOUNTER — Telehealth: Payer: Self-pay

## 2022-09-23 NOTE — Telephone Encounter (Signed)
Pharmacy Patient Advocate Encounter   Received notification from [facility] that prior authorization for PRALUENT 150 MG/ML is needed.    PA submitted on  Key [id number] Status is pending  Karie Soda, [place] Patient Advocate Specialist Direct Number: [phone] Fax: [phone]

## 2022-09-28 NOTE — Telephone Encounter (Signed)
Pt made aware that PA has been approved.

## 2022-09-28 NOTE — Telephone Encounter (Signed)
Pharmacy Patient Advocate Encounter  Prior Authorization for PRALUENT 150 MG/ML  has been approved.    PA# [id number] Effective dates: 09/28/22 through 03/31/23  Karie Soda, [place] Patient Advocate Specialist Direct Number: [phone] Fax: [phone]

## 2023-02-07 ENCOUNTER — Other Ambulatory Visit (HOSPITAL_BASED_OUTPATIENT_CLINIC_OR_DEPARTMENT_OTHER): Payer: Self-pay | Admitting: Family Medicine

## 2023-02-07 DIAGNOSIS — Z87891 Personal history of nicotine dependence: Secondary | ICD-10-CM

## 2023-02-15 ENCOUNTER — Ambulatory Visit (HOSPITAL_BASED_OUTPATIENT_CLINIC_OR_DEPARTMENT_OTHER)
Admission: RE | Admit: 2023-02-15 | Discharge: 2023-02-15 | Disposition: A | Discharge status: Home / Self Care | Payer: Medicare Other | Source: Ambulatory Visit | Attending: Family Medicine | Admitting: Family Medicine

## 2023-02-15 DIAGNOSIS — Z87891 Personal history of nicotine dependence: Secondary | ICD-10-CM | POA: Diagnosis not present

## 2023-04-11 ENCOUNTER — Other Ambulatory Visit (HOSPITAL_COMMUNITY): Payer: Self-pay

## 2023-04-11 ENCOUNTER — Telehealth: Payer: Self-pay | Admitting: Pharmacist

## 2023-04-11 ENCOUNTER — Telehealth: Payer: Self-pay | Admitting: Pharmacy Technician

## 2023-04-11 NOTE — Telephone Encounter (Signed)
PA request has been Submitted. New Encounter created for follow up. For additional info see Pharmacy Prior Auth telephone encounter from 04/11/23.

## 2023-04-11 NOTE — Telephone Encounter (Signed)
Left VM per DPR that PA was approved.

## 2023-04-11 NOTE — Telephone Encounter (Signed)
Patient needs new PA for Praluent Last LDL-C 02/16/23 was 115

## 2023-04-11 NOTE — Telephone Encounter (Signed)
Pharmacy Patient Advocate Encounter  Received notification from [facility] that Prior Authorization for praluent has been APPROVED from 04/11/23 to 07/25/23. Ran test claim, Copay is $135.00. This test claim was processed through [facility]- copay amounts may vary at other pharmacies due to pharmacy/plan contracts, or as the patient moves through the different stages of their insurance plan.   PA #/Case ID/Reference #: [id number]

## 2023-04-11 NOTE — Telephone Encounter (Signed)
Pharmacy Patient Advocate Encounter   Received notification from Pt Calls Messages that prior authorization for praluent is required/requested.   Insurance verification completed.   The patient is insured through [facility] .   Per test claim: PA required; PA submitted to [facility] via CoverMyMeds Key/confirmation #/EOC [id number] Status is pending

## 2023-05-03 ENCOUNTER — Other Ambulatory Visit: Payer: Self-pay | Admitting: Family Medicine

## 2023-05-03 DIAGNOSIS — Z1231 Encounter for screening mammogram for malignant neoplasm of breast: Secondary | ICD-10-CM

## 2023-06-27 ENCOUNTER — Ambulatory Visit: Payer: Medicare Other

## 2023-07-13 ENCOUNTER — Ambulatory Visit
Admission: RE | Admit: 2023-07-13 | Discharge: 2023-07-13 | Disposition: A | Discharge status: Home / Self Care | Payer: Medicare Other | Source: Ambulatory Visit | Attending: Family Medicine | Admitting: Family Medicine

## 2023-07-13 DIAGNOSIS — Z1231 Encounter for screening mammogram for malignant neoplasm of breast: Secondary | ICD-10-CM

## 2023-08-27 NOTE — Progress Notes (Unsigned)
Cardiology Office Note    Patient Name: Evelyn Heath Date of Encounter: 08/27/2023  Primary Care Provider:  Orpha Bur, MD Primary Cardiologist:  Donato Schultz, MD Primary Electrophysiologist: None   Past Medical History    Past Medical History:  Diagnosis Date   Anxiety    Borderline osteopenia    Elevated cholesterol with elevated triglycerides    History of shingles 10/07; 1/08; 2/08   per patient   Hyperlipidemia    Hypertension    Insomnia, persistent    Menopausal state 2000   no HRT   Osteoporosis    Vitamin deficiency     History of Present Illness  Evelyn Heath is a 78 y.o. female with a PMH of coronary calcifications, aortic atherosclerosis, left subclavian artery stenosis HLD, carotid artery stenosis (1-39%) with statin intolerance (on Praluent), HTN who presents today for annual follow-up.  Evelyn Heath was seen initially by Dr. Anne Fu in 2017 difficult to control lipids and statin intolerance.  Cardiac CT showed a calcium score of 128 subpleural left lower lobe nodule.  She is currently on Praluent for history of statin intolerance.  She had a carotid ultrasound on 07/2018 that showed bilateral mild carotid disease (1-39%) with left subclavian stenosis.  She completed her most recent duplex on 07/2022 showing no progression of carotid stenosis.  She was last seen by Dr. Anne Fu on 08/2022 and was doing well overall with her current PCSK9 inhibitor.  Evelyn Heath presents today for annual follow up.  During today's visit patient reports her blood pressure has been elevated and she has also noticed shortness of breath with exertion some episodes of dizziness. Her blood pressure today was elevated at 190/82 and was 172/82 on recheck.  She reports going to her ophthalmologist and being diagnosed with retinal artery occlusion and is currently being treated with injections.  The shortness of breath is particularly noticeable during physical activity, such as walking at a rapid  pace. The patient denies experiencing chest tightness during these episodes and does not associate the dizziness with the shortness of breath. The patient also reports a recent increase in difficulty breathing since the start of the year. The patient has a history of smoking for over 40 years and is currently on cholesterol medication (Praluent).   Review of Systems  Please see the history of present illness.    All other systems reviewed and are otherwise negative except as noted above.  Physical Exam    Wt Readings from Last 3 Encounters:  09/02/22 174 lb (78.9 kg)  07/28/21 163 lb (73.9 kg)  07/23/20 200 lb (90.7 kg)   ZO:[id number] were no vitals filed for this visit.,There is no height or weight on file to calculate BMI. GEN: Well nourished, well developed in no acute distress Neck: No JVD; No carotid bruits Pulmonary: Clear to auscultation without rales, wheezing or rhonchi  Cardiovascular: Normal rate. Regular rhythm. Normal S1. Normal S2.   Murmurs: There is no murmur.  ABDOMEN: Soft, non-tender, non-distended EXTREMITIES:  No edema; No deformity   EKG/LABS/ Recent Cardiac Studies   ECG personally reviewed by me today -   Risk Assessment/Calculations:          No results found for: "WBC", "HGB", "HCT", "MCV", "PLT" No results found for: "CREATININE", "BUN", "NA", "K", "CL", "CO2" Lab Results  Component Value Date   CHOL 206 (H) 07/18/2019   HDL 71 07/18/2019   LDLCALC 111 (H) 07/18/2019   TRIG 138 07/18/2019   CHOLHDL 2.9  07/18/2019    No results found for: "HGBA1C" Assessment & Plan    1.  Familial HLD: -LDL above target on previous testing. Patient currently on Praluent. -Check lipid panel today. -Refill Praluent prescription.  2. Shortness of Breath: -Symptoms noted with rapid walking. History of carotid and subclavian stenosis. No chest tightness reported. -Order nuclear stress test Eugenie Birks) to assess cardiac blood flow and response to stress. -Order  echocardiogram to assess cardiac structure and valve function. -Consider referral to pulmonologist given history of emphysema.  3.  Subclavian artery stenosis: -Patient's blood pressure on the left was 170/80 and right was 172/82 -no new clinical symptoms on exam  4.  Coronary artery disease: -Coronary CT completed 2017 showing 128 -Reports shortness of breath with exertion and will undergo Lexiscan for further evaluation and to rule out ischemia  5.  Mild carotid stenosis: -Most recent carotid ultrasound completed 07/2022 -Showing bilateral stenosis (1-39%) -Continue Praluent and ASA 81 mg   6. Elevated BP: -Patient's initial blood pressure was 178/80 and was 172/82 on recheck. -She will monitor BP for the next 2 weeks with plan to add BP medication if elevated and possible referral to hypertension clinic.  Disposition: Follow-up with Donato Schultz, MD or APP in 1 months Informed Consent   Shared Decision Making/Informed Consent The risks [chest pain, shortness of breath, cardiac arrhythmias, dizziness, blood pressure fluctuations, myocardial infarction, stroke/transient ischemic attack, nausea, vomiting, allergic reaction, radiation exposure, metallic taste sensation and life-threatening complications (estimated to be 1 in 10,000)], benefits (risk stratification, diagnosing coronary artery disease, treatment guidance) and alternatives of a nuclear stress test were discussed in detail with Evelyn Heath and she agrees to proceed.      Signed, Napoleon Form, Leodis Rains, NP 08/27/2023, 4:41 PM [facility]

## 2023-08-28 ENCOUNTER — Ambulatory Visit: Payer: Medicare Other | Attending: Nurse Practitioner | Admitting: Nurse Practitioner

## 2023-08-28 ENCOUNTER — Encounter: Payer: Self-pay | Admitting: Nurse Practitioner

## 2023-08-28 ENCOUNTER — Other Ambulatory Visit: Payer: Self-pay | Admitting: Cardiology

## 2023-08-28 VITALS — BP 190/82 | HR 57 | Ht 68.0 in | Wt 189.4 lb

## 2023-08-28 DIAGNOSIS — R0602 Shortness of breath: Secondary | ICD-10-CM | POA: Insufficient documentation

## 2023-08-28 DIAGNOSIS — I10 Essential (primary) hypertension: Secondary | ICD-10-CM | POA: Insufficient documentation

## 2023-08-28 DIAGNOSIS — I2510 Atherosclerotic heart disease of native coronary artery without angina pectoris: Secondary | ICD-10-CM | POA: Insufficient documentation

## 2023-08-28 DIAGNOSIS — E7849 Other hyperlipidemia: Secondary | ICD-10-CM

## 2023-08-28 DIAGNOSIS — I6523 Occlusion and stenosis of bilateral carotid arteries: Secondary | ICD-10-CM | POA: Insufficient documentation

## 2023-08-28 DIAGNOSIS — R030 Elevated blood-pressure reading, without diagnosis of hypertension: Secondary | ICD-10-CM | POA: Diagnosis present

## 2023-08-28 DIAGNOSIS — I771 Stricture of artery: Secondary | ICD-10-CM | POA: Diagnosis not present

## 2023-08-28 NOTE — Patient Instructions (Addendum)
Medication Instructions:  Your physician recommends that you continue on your current medications as directed. Please refer to the Current Medication list given to you today.  *If you need a refill on your cardiac medications before your next appointment, please call your pharmacy*  Lab Work: CMET and Lipids --- Today  If you have labs (blood work) drawn today and your tests are completely normal, you will receive your results only by: MyChart Message (if you have MyChart) OR A paper copy in the mail If you have any lab test that is abnormal or we need to change your treatment, we will call you to review the results.  Testing/Procedures: Your physician has requested that you have an echocardiogram. Echocardiography is a painless test that uses sound waves to create images of your heart. It provides your doctor with information about the size and shape of your heart and how well your heart's chambers and valves are working. This procedure takes approximately one hour. There are no restrictions for this procedure. Please do NOT wear cologne, perfume, aftershave, or lotions (deodorant is allowed). Please arrive 15 minutes prior to your appointment time.  Please note: We ask at that you not bring children with you during ultrasound (echo/ vascular) testing. Due to room size and safety concerns, children are not allowed in the ultrasound rooms during exams. Our front office staff cannot provide observation of children in our lobby area while testing is being conducted. An adult accompanying a patient to their appointment will only be allowed in the ultrasound room at the discretion of the ultrasound technician under special circumstances. We apologize for any inconvenience.   Your physician has requested that you have a lexiscan myoview. For further information please visit [email or web address] Please follow instruction sheet, as given.   Follow-Up:  Please keep a log of your blood pressure for  the next week and send/Call  that in to [place].  At [facility], you and your health needs are our priority.  As part of our continuing mission to provide you with exceptional heart care, we have created designated Provider Care Teams.  These Care Teams include your primary Cardiologist (physician) and Advanced Practice Providers (APPs -  Physician Assistants and Nurse Practitioners) who all work together to provide you with the care you need, when you need it.  Your next appointment:   1 month  The format for your next appointment:   In Person  Provider:   Robin Searing, NP  Important Information About [place]

## 2023-08-29 ENCOUNTER — Other Ambulatory Visit: Payer: Self-pay

## 2023-08-29 DIAGNOSIS — R7989 Other specified abnormal findings of blood chemistry: Secondary | ICD-10-CM

## 2023-08-29 DIAGNOSIS — I10 Essential (primary) hypertension: Secondary | ICD-10-CM

## 2023-08-29 LAB — COMPREHENSIVE METABOLIC PANEL
ALT: 11 [IU]/L (ref 0–32)
AST: 15 [IU]/L (ref 0–40)
Albumin: 4.3 g/dL (ref 3.8–4.8)
Alkaline Phosphatase: 79 [IU]/L (ref 44–121)
BUN/Creatinine Ratio: 16 (ref 12–28)
BUN: 21 mg/dL (ref 8–27)
Bilirubin Total: 0.5 mg/dL (ref 0.0–1.2)
CO2: 24 mmol/L (ref 20–29)
Calcium: 10.6 mg/dL — ABNORMAL HIGH (ref 8.7–10.3)
Chloride: 104 mmol/L (ref 96–106)
Creatinine, Ser: 1.3 mg/dL — ABNORMAL HIGH (ref 0.57–1.00)
Globulin, Total: 2.7 g/dL (ref 1.5–4.5)
Glucose: 92 mg/dL (ref 70–99)
Potassium: 5.7 mmol/L — ABNORMAL HIGH (ref 3.5–5.2)
Sodium: 141 mmol/L (ref 134–144)
Total Protein: 7 g/dL (ref 6.0–8.5)
eGFR: 43 mL/min/{1.73_m2} — ABNORMAL LOW (ref >59)

## 2023-08-29 LAB — LIPID PANEL
Chol/HDL Ratio: 3.4 {ratio} (ref 0.0–4.4)
Cholesterol, Total: 220 mg/dL — ABNORMAL HIGH (ref 100–199)
HDL: 64 mg/dL (ref >39)
LDL Chol Calc (NIH): 132 mg/dL — ABNORMAL HIGH (ref 0–99)
Triglycerides: 139 mg/dL (ref 0–149)
VLDL Cholesterol Cal: 24 mg/dL (ref 5–40)

## 2023-09-05 ENCOUNTER — Telehealth: Payer: Self-pay | Admitting: Nurse Practitioner

## 2023-09-05 DIAGNOSIS — R7989 Other specified abnormal findings of blood chemistry: Secondary | ICD-10-CM

## 2023-09-05 NOTE — Telephone Encounter (Signed)
Spoke with pt and explained that a new nephrology referral has been placed for [facility] here in [place] and someone will be reaching out to schedule an appointment in the near future. Pt verbalized understanding and had no further questions at this time.

## 2023-09-05 NOTE — Telephone Encounter (Signed)
Patient states Evelyn Searing, NP referred her to a nephrologist in [place] and she assumes this was done in error. She states she lives in [place] and she's unable to travel that far for appointments. Patient would prefer a referral to a local nephrology group if possible. Please advise.

## 2023-09-11 ENCOUNTER — Encounter (HOSPITAL_COMMUNITY): Payer: Self-pay

## 2023-09-18 ENCOUNTER — Ambulatory Visit (HOSPITAL_COMMUNITY): Payer: Medicare Other | Attending: Cardiology

## 2023-09-18 ENCOUNTER — Telehealth: Payer: Self-pay | Admitting: Nurse Practitioner

## 2023-09-18 ENCOUNTER — Ambulatory Visit (HOSPITAL_BASED_OUTPATIENT_CLINIC_OR_DEPARTMENT_OTHER): Payer: Medicare Other

## 2023-09-18 DIAGNOSIS — R0602 Shortness of breath: Secondary | ICD-10-CM | POA: Insufficient documentation

## 2023-09-18 LAB — MYOCARDIAL PERFUSION IMAGING
Estimated workload: 1
Exercise duration (min): 1 min
Exercise duration (sec): 0 s
LV dias vol: 49 mL (ref 46–106)
LV sys vol: 20 mL
MPHR: 144 {beats}/min
Nuc Stress EF: 59 %
Peak HR: 75 {beats}/min
Percent HR: 52 %
Rest HR: 57 {beats}/min
Rest Nuclear Isotope Dose: 10.7 mCi
SDS: 5
SRS: 0
SSS: 5
ST Depression (mm): 0 mm
Stress Nuclear Isotope Dose: 30.9 mCi
TID: 0.99

## 2023-09-18 LAB — ECHOCARDIOGRAM COMPLETE
Area-P 1/2: 2.36 cm2
Height: 68 in
S' Lateral: 2.9 cm
Weight: 3024 [oz_av]

## 2023-09-18 MED ORDER — TECHNETIUM TC 99M TETROFOSMIN IV KIT
10.7000 | PACK | Freq: Once | INTRAVENOUS | Status: AC | PRN
Start: 2023-09-18 — End: 2023-09-18
  Administered 2023-09-18: 10.7 via INTRAVENOUS

## 2023-09-18 MED ORDER — TECHNETIUM TC 99M TETROFOSMIN IV KIT
30.9000 | PACK | Freq: Once | INTRAVENOUS | Status: AC | PRN
  Administered 2023-09-18: 30.9 via INTRAVENOUS

## 2023-09-18 MED ORDER — REGADENOSON 0.4 MG/5ML IV SOLN
0.4000 mg | Freq: Once | INTRAVENOUS | Status: AC
  Administered 2023-09-18: 0.4 mg via INTRAVENOUS

## 2023-09-22 ENCOUNTER — Telehealth: Payer: Self-pay | Admitting: Nurse Practitioner

## 2023-09-22 MED ORDER — AMLODIPINE BESYLATE 5 MG PO TABS: 5.0000 mg | ORAL_TABLET | Freq: Every day | ORAL | 3 refills | Status: AC

## 2023-09-22 NOTE — Addendum Note (Signed)
 Addended by: Erick Alley on: 09/22/2023 01:40 PM   Modules accepted: Orders

## 2023-09-22 NOTE — Telephone Encounter (Signed)
 Spoke with pt regarding her blood pressure management. Pt agreed to start Amlodipine 5 mg once daily. Prescription was ordered and sent to the pt's pharmacy of choice. Pt also agreed to continue monitoring her blood pressure for the next two weeks. Pt verbalized understanding and all, if any, questions were answered.

## 2023-09-22 NOTE — Telephone Encounter (Signed)
 Please contact Evelyn Heath and let her know that her blood pressures have been elevated and are currently not at her goal of 130/80 or less.  Per our conversation in clinic we will start you on medication for better control.  Please start patient on amlodipine 5 mg daily please advise patient to continue to monitor BP over the next 2 weeks and if remaining elevated we will increase medication and refer you to the hypertension clinic.  Please let me know if you have any further questions.

## 2023-09-24 NOTE — Progress Notes (Unsigned)
 Cardiology Office Note    Patient Name: Evelyn Heath Date of Encounter: 09/24/2023  Primary Care Provider:  Orpha Bur, MD Primary Cardiologist:  Donato Schultz, MD Primary Electrophysiologist: None   Past Medical History    Past Medical History:  Diagnosis Date   Anxiety    Borderline osteopenia    Elevated cholesterol with elevated triglycerides    History of shingles 10/07; 1/08; 2/08   per patient   Hyperlipidemia    Hypertension    Insomnia, persistent    Menopausal state 2000   no HRT   Osteoporosis    Vitamin deficiency     History of Present Illness  Evelyn Heath is a 78 y.o. female with a PMH of coronary calcifications, aortic atherosclerosis, left subclavian artery stenosis HLD, carotid artery stenosis (1-39%) with statin intolerance (on Praluent), HTN who presents today for 1 month follow-up.  Evelyn Heath was seen last in our office on 08/28/2023 for annual follow-up.  During her visit her blood pressure was elevated patient noted increased shortness of breath with activity.  She underwent a Lexiscan Myoview that showed no evidence of infarction was low risk.  She also underwent 2D echo that showed normal EF of 55 to 60% with mild LVH and no significant valve abnormalities.  She was advised to monitor blood pressures for 2 weeks prior to initiating BP therapy.  Patient's blood pressures remain elevated and she was started on amlodipine 5 mg daily.   Evelyn Heath presents with concerns about her new blood pressure medication. She has not started her Amlodipine yet due to concerns about potential side effects and the best time of day to take it. The patient also reports issues with her cholesterol medication, Praluent, and is unsure if it has been refilled. She has been on Praluent for several years and has not missed any doses. Despite this, her LDL cholesterol remains above the goal. The patient also mentions weight gain and a decrease in physical activity, particularly  during the winter months. She has previously taken phentermine for weight management and found it effective, but has concerns about potential interactions with her other medications. The patient also reports persistent shortness of breath, which she suspects may be related to deconditioning.  Her blood pressure today was stable at 132/88 and heart rate was 66 bpm.  During today's visit we reviewed her most recent echo and Lexi scan results and patient had all questions answered to her satisfaction.   Patient denies chest pain, palpitations, dyspnea, PND, orthopnea, nausea, vomiting, dizziness, syncope, edema, weight gain, or early satiety.  Review of Systems  Please see the history of present illness.    All other systems reviewed and are otherwise negative except as noted above.  Physical Exam    Wt Readings from Last 3 Encounters:  09/18/23 189 lb (85.7 kg)  08/28/23 189 lb 6.4 oz (85.9 kg)  09/02/22 174 lb (78.9 kg)   [id number] were no vitals filed for this visit.,There is no height or weight on file to calculate BMI. GEN: Well nourished, well developed in no acute distress Neck: No JVD; No carotid bruits Pulmonary: Clear to auscultation without rales, wheezing or rhonchi  Cardiovascular: Normal rate. Regular rhythm. Normal S1. Normal S2.   Murmurs: There is no murmur.  ABDOMEN: Soft, non-tender, non-distended EXTREMITIES:  No edema; No deformity   EKG/LABS/ Recent Cardiac Studies   ECG personally reviewed by me today -sinus rhythm with rate of 67 bpm and no acute changes consistent  with previous EKG.  Risk Assessment/Calculations:          No results found for: "WBC", "HGB", "HCT", "MCV", "PLT" Lab Results  Component Value Date   CREATININE 1.30 (H) 08/28/2023   BUN 21 08/28/2023   NA 141 08/28/2023   K 5.7 (H) 08/28/2023   CL 104 08/28/2023   CO2 24 08/28/2023   Lab Results  Component Value Date   CHOL 220 (H) 08/28/2023   HDL 64 08/28/2023   LDLCALC 132 (H)  08/28/2023   TRIG 139 08/28/2023   CHOLHDL 3.4 08/28/2023    No results found for: "HGBA1C" Assessment & Plan    1.  Primary HTN: -His blood pressures are stable but still mildly elevated 88 -Patient was previously started on amlodipine however did not start due to concerns regarding interactions with phentermine -Patient advised to start amlodipine 5 mg daily monitor for side effects, particularly dizziness and ankle swelling. -Check blood pressure daily for the next two weeks and report readings.  2.  Coronary artery disease: -Lexiscan Myoview completed showing no evidence of ischemia and was low risk -Patient reports no complaints of chest pain or angina since previous follow-up. -Continue ASA 81 mg Praluent 150 milligrams per mL q. 14 days  3.Subclavian artery stenosis: -Patient had left subclavian steal disease noted on most recent carotid duplex with normal flow in the right subclavian  4.Familial HLD:  -Patient's most recent LDL cholesterol above goal at 132 total cholesterol of 220 -She reports compliance with her Praluent and denies any missed doses. -We will refer back to lipid clinic to discuss to discuss possible nonstatin medications to be added to current therapy  5.  Mild carotid stenosis: -Previous carotid duplex completed 07/2022 with bilateral 1-39% stenosis unchanged from previous study -Continue current GDMT with ASA 81 and Praluent  6.  Shortness of breath: -No cardiac or pulmonary cause identified. Likely due to deconditioning. -Encourage regular exercise, such as walking.  Disposition: Follow-up with Donato Schultz, MD or APP in 3 months    Signed, Napoleon Form, Leodis Rains, NP 09/24/2023, 1:45 PM [facility]

## 2023-09-25 ENCOUNTER — Ambulatory Visit: Payer: Medicare Other | Attending: Nurse Practitioner | Admitting: Nurse Practitioner

## 2023-09-25 ENCOUNTER — Encounter: Payer: Self-pay | Admitting: Nurse Practitioner

## 2023-09-25 VITALS — BP 132/88 | HR 66 | Ht 68.5 in | Wt 190.6 lb

## 2023-09-25 DIAGNOSIS — I771 Stricture of artery: Secondary | ICD-10-CM | POA: Insufficient documentation

## 2023-09-25 DIAGNOSIS — I10 Essential (primary) hypertension: Secondary | ICD-10-CM | POA: Insufficient documentation

## 2023-09-25 DIAGNOSIS — I2510 Atherosclerotic heart disease of native coronary artery without angina pectoris: Secondary | ICD-10-CM | POA: Insufficient documentation

## 2023-09-25 DIAGNOSIS — E7849 Other hyperlipidemia: Secondary | ICD-10-CM | POA: Diagnosis not present

## 2023-09-25 DIAGNOSIS — I6523 Occlusion and stenosis of bilateral carotid arteries: Secondary | ICD-10-CM | POA: Diagnosis not present

## 2023-09-25 DIAGNOSIS — R0602 Shortness of breath: Secondary | ICD-10-CM | POA: Diagnosis present

## 2023-09-25 MED ORDER — PRALUENT 150 MG/ML ~~LOC~~ SOAJ: 150.0000 mg | SUBCUTANEOUS | 3 refills | Status: AC

## 2023-09-25 NOTE — Patient Instructions (Signed)
 Medication Instructions:  Refilled Praluent *If you need a refill on your cardiac medications before your next appointment, please call your pharmacy*   Follow-Up: At [facility], you and your health needs are our priority.  As part of our continuing mission to provide you with exceptional heart care, we have created designated Provider Care Teams.  These Care Teams include your primary Cardiologist (physician) and Advanced Practice Providers (APPs -  Physician Assistants and Nurse Practitioners) who all work together to provide you with the care you need, when you need it.  We recommend signing up for the patient portal called "MyChart".  Sign up information is provided on this After Visit Summary.  MyChart is used to connect with patients for Virtual Visits (Telemedicine).  Patients are able to view lab/test results, encounter notes, upcoming appointments, etc.  Non-urgent messages can be sent to your provider as well.   To learn more about what you can do with MyChart, go to [email or web address].    Your next appointment:   3 months  Provider:   Dr. Anne Fu, MD or Robin Searing, NP  Other Instructions You have been referred to our Pharmacist for lipid management  Check blood pressure and send [place] readings via MyChart    1st Floor: - Lobby - Registration  - Pharmacy  - Lab - Cafe  2nd Floor: - PV Lab - Diagnostic Testing (echo, CT, nuclear med)  3rd Floor: - Vacant  4th Floor: - TCTS (cardiothoracic surgery) - AFib Clinic - Structural Heart Clinic - Vascular Surgery  - Vascular Ultrasound  5th Floor: - HeartCare Cardiology (general and EP) - Clinical Pharmacy for coumadin, hypertension, lipid, weight-loss medications, and med management appointments    Valet parking services will be available as well.

## 2023-10-11 ENCOUNTER — Telehealth: Payer: Self-pay | Admitting: Nurse Practitioner

## 2023-10-11 NOTE — Telephone Encounter (Signed)
 Paper Work Dropped Off: Blood Pressure Readings  Date: 10/11/2023  Location of paper:  Robin Searing [employer]

## 2023-10-12 ENCOUNTER — Telehealth: Payer: Self-pay | Admitting: Nurse Practitioner

## 2023-10-12 NOTE — Telephone Encounter (Signed)
 See other phone note with Ernest's recommendations.

## 2023-10-12 NOTE — Telephone Encounter (Signed)
 Please let Ms. Keeven know that I reviewed her blood pressures and they appear to be normalizing based on readings starting on 3/12.  I would advise to continue her current dose of amlodipine 5 mg continue to abstain from excess salt in her diet. If your blood pressures continue to increase past your goal of 130/80 we will increase amlodipine and place a referral to hypertension clinic.    Robin Searing, NP

## 2023-10-12 NOTE — Telephone Encounter (Signed)
 Patient has been notified directly; all questions, if any, were answered. Patient voiced understanding and thanked me for calling.

## 2023-11-08 NOTE — Progress Notes (Unsigned)
 Patient ID: Evelyn Heath                 DOB: January 02, 1946                    MRN: [id number]      HPI: Evelyn Heath is a 78 y.o. female patient referred to lipid clinic by Charles Connor, NP. PMH is significant for CAD w/ coronary calcium 128, 77th percentile, HLD, HTN   Patient has a history of statin intolerance  Her current regimen includes Praluent, when she has been on for several years and is fully compliant, no missed doses. She is currently out of medication. LDL 132 remains above goal.   At last cardiology visit 09/25/23, patient was advised to monitor BP two weeks before starting amlodipine. She had not started due to concerns about potential AEs and when to take it. She has had a decrease in physical activity over winter months with some weight gain. BP in office 132/88, no reported symptoms. Patient encouraged to start amlodipine 5 mg daily and monitor for dizziness and ankle swelling.   At today's visit: -- out of praluent?    Reviewed options for lowering LDL cholesterol, including ezetimibe, PCSK-9 inhibitors, bempedoic acid and inclisiran.  Discussed mechanisms of action, dosing, side effects and potential decreases in LDL cholesterol.  Also reviewed cost information and potential options for patient assistance.  Home BP ~130-140/78-85   Current Medications: alirocumab (Praluent) 150 mg  Intolerances: rosuvastatin - leg cramps, lovastatin muscle cramps, ezetimibe - itching, atorvastatin- numbness in the fingers  Risk Factors: CAD w/ coronary calcium 128, 77th percentile, HLD, HTN LDL-C goal: <70 mg/dl ApoB goal: < 80 mg/dl  TG goal: < 782 mg/dl   Last lab: LDL 135 mg while on Praluent 150 mg Q14D  Diet: does not eat deep fried food, eat out once a week.  Like Vegetables, likes bread and ice cream ( 2/3 cup every night)   Exercise: love doing yard work, none   Family History:  Relation Problem Comments  Mother (Deceased at age 72) Cancer Bone primary site unknown     Father (Deceased at age 57) Cancer (Age: 37) CLL  Diabetes (Age: 75)     Maternal Grandmother (Deceased at age 29)   Brother ( alive, 3 years younger than patient) : atrial fibrillation on blood thinner  Sister( 3 years older than patient): morbid obesity and died from Afib   Paternal grand father: CAD  Social History:  Alcohol: none  Smoking: none  Labs: Lipid Panel     Component Value Date/Time   CHOL 220 (H) 08/28/2023 1235   TRIG 139 08/28/2023 1235   HDL 64 08/28/2023 1235   CHOLHDL 3.4 08/28/2023 1235   LDLCALC 132 (H) 08/28/2023 1235   LABVLDL 24 08/28/2023 1235    Past Medical History:  Diagnosis Date   Anxiety    Borderline osteopenia    Elevated cholesterol with elevated triglycerides    History of shingles 10/07; 1/08; 2/08   per patient   Hyperlipidemia    Hypertension    Insomnia, persistent    Menopausal state 2000   no HRT   Osteoporosis    Vitamin deficiency     Current Outpatient Medications on File Prior to Visit  Medication Sig Dispense Refill   Alirocumab (PRALUENT) 150 MG/ML SOAJ Inject 1 mL (150 mg total) as directed every 14 (fourteen) days. 6 mL 3   ALPRAZolam (XANAX) 0.25 MG tablet Take 0.25  mg by mouth at bedtime as needed for anxiety or sleep.      amLODipine (NORVASC) 5 MG tablet Take 1 tablet (5 mg total) by mouth daily. 90 tablet 3   aspirin EC 81 MG tablet Take 81 mg by mouth every other day. Bruising on daily dosing     B Complex Vitamins (VITAMIN B COMPLEX PO) TAKE 1 TABLET BY MOUTH EVERY 2-3 WEEKS     cholecalciferol (VITAMIN D) 1000 UNITS tablet Take 2,000 Units by mouth daily.     FLUoxetine (PROZAC) 20 MG capsule Take 20 mg by mouth every morning.      Melatonin 1 MG CAPS Take 3 mg by mouth at bedtime.      omeprazole (PRILOSEC) 20 MG capsule Take 20 mg by mouth 2 (two) times a week.     phentermine 37.5 MG capsule Take 37.5 mg by mouth every morning.     No current facility-administered medications on file prior to visit.     Allergies  Allergen Reactions   Atorvastatin     Numbness in fingers  Other Reaction(s): numbness in fingers   Colesevelam     Other Reaction(s): unknown   Ezetimibe     Other Reaction(s): itching   Lovastatin     Other Reaction(s): muscle pain   Meloxicam     Upset stomach   Statins     numbness   Sulfamethoxazole-Trimethoprim Nausea Only    Other Reaction(s): GI Intolerance   Terramycin [Oxytetracycline]    Codeine Rash    Other Reaction(s): rash in throat   Rosuvastatin     Other Reaction(s): leg cramp/aches   Tetracyclines & Related Rash    Rash in mouth    Assessment/Plan:  1. Hyperlipidemia -  No problem-specific Assessment & Plan notes found for this encounter.    Thank you,  Jolyn Needles, PharmD Candidate [employer] [facility] 11/09/2023

## 2023-11-09 ENCOUNTER — Other Ambulatory Visit (HOSPITAL_COMMUNITY): Payer: Self-pay

## 2023-11-09 ENCOUNTER — Ambulatory Visit: Attending: Internal Medicine | Admitting: Pharmacist

## 2023-11-09 ENCOUNTER — Encounter: Payer: Self-pay | Admitting: Pharmacist

## 2023-11-09 ENCOUNTER — Telehealth: Payer: Self-pay | Admitting: Pharmacy Technician

## 2023-11-09 ENCOUNTER — Telehealth: Payer: Self-pay | Admitting: Pharmacist

## 2023-11-09 VITALS — BP 142/70 | HR 61

## 2023-11-09 DIAGNOSIS — E7849 Other hyperlipidemia: Secondary | ICD-10-CM

## 2023-11-09 DIAGNOSIS — I700 Atherosclerosis of aorta: Secondary | ICD-10-CM | POA: Diagnosis not present

## 2023-11-09 NOTE — Progress Notes (Signed)
 Patient ID: Evelyn Heath                 DOB: 04-Oct-1945                    MRN: [id number]      HPI: Evelyn Heath is a 78 y.o. female patient referred to lipid clinic by Robin Searing, NP. PMH is significant for CAD w/ coronary calcium 128, 77th percentile, HLD, HTN   Patient has a history of statin intolerance.Her current regimen includes Praluent, when she has been on for several years and is fully compliant, no missed doses. She is currently out of medication. LDL 132 remains above goal.   At last cardiology visit 09/25/23, patient was advised to monitor BP two weeks before starting amlodipine. She had not started due to concerns about potential AEs and when to take it. She has had a decrease in physical activity over winter months with some weight gain. BP in office 132/88, no reported symptoms. Patient encouraged to start amlodipine 5 mg daily and monitor for dizziness and ankle swelling.   At today's visit: patient reports she has been taking Praluent faithfully since 2018. Tolerates it well. She had tried multiple statins in the past does not recall name or dose of all of the statins. But recalls all caused figures numbness and severe joint pain. She refuse to try any other statins however open to try non statin lipid lowering agent. She follows heart healthy diet but ice cream and breads are her weakness. She consume them in moderation. 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  Reviewed  bempedoic acid.  Discussed mechanisms of action, dosing, side effects and potential decreases in LDL cholesterol.  Also reviewed cost information and potential options for patient assistance.  Reports her BP is managed by PCP, provided with home monitor that send readings to her PCP. Reviewed correct steps for home BP measurement.   Home BP ~130-140/78-85. Will be seeing PCP in June   Current Medications: alirocumab (Praluent) 150 mg  Intolerances: rosuvastatin - leg  cramps, lovastatin muscle cramps, ezetimibe - itching, atorvastatin- numbness in the fingers  Risk Factors: CAD w/ coronary calcium 128, 77th percentile, HLD, HTN LDL-C goal: <70 mg/dl ApoB goal: < 80 mg/dl  TG goal: < 782 mg/dl   Last lab: LDL 956 mg while on Praluent 150 mg Q14D  Diet: does not eat deep fried food, eat out once a week.  Like Vegetables, likes bread and ice cream ( 2/3 cup every night)   Exercise: love doing yard work, none   Family History:  Relation Problem Comments  Mother (Deceased at age 75) Cancer Bone primary site unknown    Father (Deceased at age 5) Cancer (Age: 47) CLL  Diabetes (Age: 21)     Maternal Grandmother (Deceased at age 33)   Brother ( alive, 3 years younger than patient) : atrial fibrillation on blood thinner  Sister( 3 years older than patient): morbid obesity and died from Afib   Paternal grand father: CAD  Social History:  Alcohol: none  Smoking: none  Labs: Lipid Panel     Component Value Date/Time   CHOL 220 (H) 08/28/2023 1235   TRIG 139 08/28/2023 1235   HDL 64 08/28/2023 1235   CHOLHDL 3.4 08/28/2023 1235   LDLCALC 132 (H) 08/28/2023 1235   LABVLDL 24 08/28/2023 1235    Past Medical History:  Diagnosis Date   Anxiety  Borderline osteopenia    Elevated cholesterol with elevated triglycerides    History of shingles 10/07; 1/08; 2/08   per patient   Hyperlipidemia    Hypertension    Insomnia, persistent    Menopausal state 2000   no HRT   Osteoporosis    Vitamin deficiency     Current Outpatient Medications on File Prior to Visit  Medication Sig Dispense Refill   Alirocumab (PRALUENT) 150 MG/ML SOAJ Inject 1 mL (150 mg total) as directed every 14 (fourteen) days. 6 mL 3   ALPRAZolam (XANAX) 0.25 MG tablet Take 0.25 mg by mouth at bedtime as needed for anxiety or sleep.      amLODipine (NORVASC) 5 MG tablet Take 1 tablet (5 mg total) by mouth daily. 90 tablet 3   aspirin EC 81 MG tablet Take 81 mg by mouth every  other day. Bruising on daily dosing     B Complex Vitamins (VITAMIN B COMPLEX PO) TAKE 1 TABLET BY MOUTH EVERY 2-3 WEEKS     cholecalciferol (VITAMIN D) 1000 UNITS tablet Take 2,000 Units by mouth daily.     FLUoxetine (PROZAC) 20 MG capsule Take 20 mg by mouth every morning.      Melatonin 1 MG CAPS Take 3 mg by mouth at bedtime.      omeprazole (PRILOSEC) 20 MG capsule Take 20 mg by mouth 2 (two) times a week.     phentermine 37.5 MG capsule Take 37.5 mg by mouth every morning.     No current facility-administered medications on file prior to visit.    Allergies  Allergen Reactions   Atorvastatin     Numbness in fingers  Other Reaction(s): numbness in fingers   Colesevelam     Other Reaction(s): unknown   Ezetimibe     Other Reaction(s): itching   Lovastatin     Other Reaction(s): muscle pain   Meloxicam     Upset stomach   Statins     numbness   Sulfamethoxazole-Trimethoprim Nausea Only    Other Reaction(s): GI Intolerance   Terramycin [Oxytetracycline]    Codeine Rash    Other Reaction(s): rash in throat   Rosuvastatin     Other Reaction(s): leg cramp/aches   Tetracyclines & Related Rash    Rash in mouth    Assessment/Plan:  1. Hyperlipidemia -  Familial hyperlipidemia Assessment:  LDL goal: < 70 mg/dl last LDLc 161 mg/dl while on Praluent 150 mg Q14D  Intolerance to multiple statins ( does not recall name or doses)- myalgia and fingers numbness, ezetimibe - itching   Discussed  bempedoic acid; cost, dosing efficacy, side effects  Follows healthy diet, stays active but does not follow any exercise regimen   Plan: Continue taking current medications (Praluent 150 mg Q14D) Will apply for PA for Nexletol  Will get baseline uric acid level and NMR with apoB and Lp(a)  Thank you,  Nickola Baron, Pharm.D [facility] [address], [place], [place] [address]  Phone: [phone]; Fax: [phone]

## 2023-11-09 NOTE — Assessment & Plan Note (Signed)
 Assessment:  LDL goal: < 70 mg/dl last LDLc 161 mg/dl while on Praluent 150 mg Q14D  Intolerance to multiple statins ( does not recall name or doses)- myalgia and fingers numbness, ezetimibe - itching   Discussed  bempedoic acid; cost, dosing efficacy, side effects  Follows healthy diet, stays active but does not follow any exercise regimen   Plan: Continue taking current medications (Praluent 150 mg Q14D) Will apply for PA for Nexletol  Will get baseline uric acid level and NMR with apoB and Lp(a)

## 2023-11-09 NOTE — Patient Instructions (Addendum)
 Your Results:             Your most recent labs Goal  Total Cholesterol 220 < 200  Triglycerides 139 < 150  HDL (happy/good cholesterol) 64 > 40  LDL (lousy/bad cholesterol 132 < 70   Medication changes: Continue taking Praluent 150 mg every 14 days. We will assess coverage for Nexletol 180 mg. Will call you when we receive PA approval from you insurance. Get the uric acid and other lipid lab drawn today    Follow up Lab orders: We want to repeat labs after 2-3 months.  We will send you a lab order to remind you once we get closer to that time.          HOW TO TAKE YOUR BLOOD PRESSURE AT HOME  Rest 5 minutes before taking your blood pressure.  Don't smoke or drink caffeinated beverages for at least 30 minutes before. Take your blood pressure before (not after) you eat. Sit comfortably with your back supported and both feet on the floor (don't cross your legs). Elevate your arm to heart level on a table or a desk. Use the proper sized cuff. It should fit smoothly and snugly around your bare upper arm. There should be enough room to slip a fingertip under the cuff. The bottom edge of the cuff should be 1 inch above the crease of the elbow. Ideally, take 3 measurements at one sitting and record the average.  Important lifestyle changes to control high blood pressure  Intervention  Effect on the BP  Lose extra pounds and watch your waistline Weight loss is one of the most effective lifestyle changes for controlling blood pressure. If you're overweight or obese, losing even a small amount of weight can help reduce blood pressure. Blood pressure might go down by about 1 millimeter of mercury (mm Hg) with each kilogram (about 2.2 pounds) of weight lost.  Exercise regularly As a general goal, aim for at least 30 minutes of moderate physical activity every day. Regular physical activity can lower high blood pressure by about 5 to 8 mm Hg.  Eat a healthy diet Eating a diet rich in whole  grains, fruits, vegetables, and low-fat dairy products and low in saturated fat and cholesterol. A healthy diet can lower high blood pressure by up to 11 mm Hg.  Reduce salt (sodium) in your diet Even a small reduction of sodium in the diet can improve heart health and reduce high blood pressure by about 5 to 6 mm Hg.  Limit alcohol One drink equals 12 ounces of beer, 5 ounces of wine, or 1.5 ounces of 80-proof liquor.  Limiting alcohol to less than one drink a day for women or two drinks a day for men can help lower blood pressure by about 4 mm Hg.   If you have any questions or concerns please use My Chart to send questions or call the office at [phone]

## 2023-11-09 NOTE — Telephone Encounter (Signed)
 Pharmacy Patient Advocate Encounter   Received notification from Pt Calls Messages that prior authorization for NEXLETOL is required/requested.   Insurance verification completed.   The patient is insured through [employer] [employer] [facility] .   Per test claim: PA required; PA submitted to above mentioned insurance via CoverMyMeds Key/confirmation #/EOC [id number] Status is pending

## 2023-11-10 ENCOUNTER — Other Ambulatory Visit (HOSPITAL_COMMUNITY): Payer: Self-pay

## 2023-11-10 ENCOUNTER — Encounter: Payer: Self-pay | Admitting: Pharmacist

## 2023-11-10 LAB — NMR, LIPOPROFILE
Cholesterol, Total: 215 mg/dL — ABNORMAL HIGH (ref 100–199)
HDL Particle Number: 38.1 umol/L (ref >=30.5)
HDL-C: 63 mg/dL (ref >39)
LDL Particle Number: 1247 nmol/L — ABNORMAL HIGH (ref <1000)
LDL Size: 22 nm (ref >20.5)
LDL-C (NIH Calc): 121 mg/dL — ABNORMAL HIGH (ref 0–99)
LP-IR Score: 40 (ref <=45)
Small LDL Particle Number: 407 nmol/L (ref <=527)
Triglycerides: 180 mg/dL — ABNORMAL HIGH (ref 0–149)

## 2023-11-10 LAB — LIPOPROTEIN A (LPA): Lipoprotein (a): 346.9 nmol/L — ABNORMAL HIGH (ref <75.0)

## 2023-11-10 LAB — APOLIPOPROTEIN B: Apolipoprotein B: 104 mg/dL — ABNORMAL HIGH (ref <90)

## 2023-11-10 LAB — URIC ACID: Uric Acid: 5.3 mg/dL (ref 3.1–7.9)

## 2023-11-10 NOTE — Telephone Encounter (Signed)
 Pharmacy Patient Advocate Encounter  Received notification from [employer] that Prior Authorization for nexletol  has been APPROVED from 10/26/23 to 07/24/2098. Ran test claim, Copay is $146.46. This test claim was processed through [facility]- copay amounts may vary at other pharmacies due to pharmacy/plan contracts, or as the patient moves through the different stages of their insurance plan.

## 2023-11-13 MED ORDER — BEMPEDOIC ACID 180 MG PO TABS: 180.0000 mg | ORAL_TABLET | Freq: Every day | ORAL | 11 refills | Status: DC

## 2023-11-13 NOTE — Addendum Note (Signed)
 Addended by: Embree Brawley K on: 11/13/2023 11:18 AM   Modules accepted: Orders

## 2023-11-13 NOTE — Telephone Encounter (Signed)
 Pt made aware of PA approval. Will start nexletol  and repeat uric acid lab in 1 month. Patient is willing to  re-try Zetia. Will add Zetia in 1 month.  Phone follow up due on mid May.

## 2023-11-17 NOTE — Telephone Encounter (Signed)
 PA approved see other encounter for more info

## 2023-12-02 LAB — LAB REPORT - SCANNED
Albumin, Urine POC: 20.9
Albumin/Creatinine Ratio, Urine, POC: 8
Creatinine, POC: 255.2 mg/dL
EGFR: 36

## 2023-12-07 ENCOUNTER — Other Ambulatory Visit: Payer: Self-pay | Admitting: Nephrology

## 2023-12-07 DIAGNOSIS — N1831 Chronic kidney disease, stage 3a: Secondary | ICD-10-CM

## 2023-12-12 ENCOUNTER — Ambulatory Visit
Admission: RE | Admit: 2023-12-12 | Discharge: 2023-12-12 | Disposition: A | Discharge status: Home / Self Care | Source: Ambulatory Visit | Attending: Nephrology | Admitting: Nephrology

## 2023-12-12 DIAGNOSIS — N1831 Chronic kidney disease, stage 3a: Secondary | ICD-10-CM

## 2023-12-14 ENCOUNTER — Ambulatory Visit: Payer: Self-pay | Admitting: Pharmacist

## 2023-12-14 LAB — URIC ACID: Uric Acid: 6.2 mg/dL (ref 3.1–7.9)

## 2023-12-25 ENCOUNTER — Ambulatory Visit: Attending: Cardiology | Admitting: Cardiology

## 2023-12-25 ENCOUNTER — Encounter: Payer: Self-pay | Admitting: Cardiology

## 2023-12-25 VITALS — BP 134/67 | HR 60 | Ht 68.5 in | Wt 190.0 lb

## 2023-12-25 DIAGNOSIS — I700 Atherosclerosis of aorta: Secondary | ICD-10-CM

## 2023-12-25 DIAGNOSIS — I771 Stricture of artery: Secondary | ICD-10-CM | POA: Diagnosis not present

## 2023-12-25 DIAGNOSIS — E7849 Other hyperlipidemia: Secondary | ICD-10-CM

## 2023-12-25 DIAGNOSIS — I2510 Atherosclerotic heart disease of native coronary artery without angina pectoris: Secondary | ICD-10-CM

## 2023-12-25 NOTE — Progress Notes (Signed)
 Cardiology Office Note:  .   Date:  12/25/2023  ID:  Evelyn Heath, DOB 12/15/1945, MRN [id number] PCP: Arlys Berke, MD  [facility] Providers Cardiologist:  Dorothye Gathers, MD     History of Present Illness: .   Evelyn Heath is a 78 y.o. female Discussed the use of AI scribe software for clinical note transcription with the patient, who gave verbal consent to proceed.  History of Present Illness Evelyn Heath is a 78 year old female with coronary artery disease and chronic kidney disease who presents for follow-up.  She has a history of coronary artery disease, chronic kidney disease stage 3A, hyperlipidemia, and aortic atherosclerosis. She is intolerant to statins and is currently on Praluent  and bempedoic acid  (Nexlizet) for hyperlipidemia management. She missed her Praluent  shot on Sunday but plans to take it today.  She has a retinal vein occlusion in her left eye and is receiving aflibercept injections. Her eye doctor suggested that high blood pressure might have contributed to this condition. She is now on amlodipine  for blood pressure management.  She experiences tendonitis in her left wrist, for which x-rays have been done, but the exact cause is still uncertain. She is considering consulting her general practitioner for further evaluation.  She had COVID-19 a year ago and feels she has not been the same since, attributing some of her symptoms to aging. She experienced difficulty with physical exertion at high altitudes, such as during a visit to the [facility], but manages daily activities like climbing stairs at home without significant issues.  She is concerned about weight gain and is considering returning to [employer], as she is a life member. She has previously used phentermine for weight loss but is cautious about using it again due to her current medications.  She is participating in a home blood pressure monitoring program initiated by Dr. Miki Alert at  [place], which records her blood pressure readings.      ROS: No CP  Studies Reviewed: .        Results LABS Creatinine: 1.3  RADIOLOGY Left wrist X-ray: tendinitis Risk Assessment/Calculations:            Physical Exam:   VS:  BP 134/67   Pulse 60   Ht 5' 8.5" (1.74 m)   Wt 190 lb (86.2 kg)   LMP  (LMP Unknown)   SpO2 94%   BMI 28.47 kg/m    Wt Readings from Last 3 Encounters:  12/25/23 190 lb (86.2 kg)  09/25/23 190 lb 9.6 oz (86.5 kg)  09/18/23 189 lb (85.7 kg)    GEN: Well nourished, well developed in no acute distress NECK: No JVD; No carotid bruits CARDIAC: RRR, no murmurs, no rubs, no gallops RESPIRATORY:  Clear to auscultation without rales, wheezing or rhonchi  ABDOMEN: Soft, non-tender, non-distended EXTREMITIES:  No edema; No deformity   ASSESSMENT AND PLAN: .    Assessment and Plan Assessment & Plan Coronary artery disease without angina Coronary artery disease is well-managed without angina. She adheres to the Praluent  regimen despite a missed dose, which she plans to take today.  Aortic atherosclerosis Aortic atherosclerosis is well-managed with current medications. No new symptoms reported.  Left subclavian artery stenosis Left subclavian artery stenosis previously discussed with Renelda Carry. No new symptoms reported.  Hypertension Hypertension is managed with amlodipine . Home blood pressure monitoring program in place with Dr. Miki Alert. She reports no prior use of hypertension medication.  Hyperlipidemia Hyperlipidemia is managed with Praluent   and bempedoic acid .  Appreciate lipid clinic.  uric acid levels slightly elevated but within normal range.  Chronic kidney disease, stage 3A Chronic kidney disease stage 3A is well-managed. Previous creatinine level was 1.3.  Retinal vein occlusion Retinal vein occlusion in the left eye is managed with aflibercept injections. Next injection scheduled for July. Ophthalmologist attributes the condition to  hypertension, now managed with amlodipine .  Weight management She expresses concern about weight gain and inquires about phentermine, which is advised against due to potential interactions with current medications. Discussed alternative weight management options including referral to a healthy weight and wellness clinic and consideration of [employer] program. - Consider referral to healthy weight and wellness clinic. - Encourage participation in [employer] program.         Dispo: 1 yr with Renelda Carry, 2 yr with me.  Seeing lipid clinic as well.  Signed, Dorothye Gathers, MD

## 2023-12-25 NOTE — Patient Instructions (Signed)
 Medication Instructions:  The current medical regimen is effective;  continue present plan and medications.  *If you need a refill on your cardiac medications before your next appointment, please call your pharmacy*  Follow-Up: At [facility], you and your health needs are our priority.  As part of our continuing mission to provide you with exceptional heart care, our providers are all part of one team.  This team includes your primary Cardiologist (physician) and Advanced Practice Providers or APPs (Physician Assistants and Nurse Practitioners) who all work together to provide you with the care you need, when you need it.  Your next appointment:   1 year(s)  Provider:   Charles Connor, NP      Then, Dorothye Gathers, MD will plan to see you again in 2 year(s).    We recommend signing up for the patient portal called "MyChart".  Sign up information is provided on this After Visit Summary.  MyChart is used to connect with patients for Virtual Visits (Telemedicine).  Patients are able to view lab/test results, encounter notes, upcoming appointments, etc.  Non-urgent messages can be sent to your provider as well.   To learn more about what you can do with MyChart, go to [email or web address].   Thank you for choosing [facility]!!

## 2024-01-08 ENCOUNTER — Telehealth: Payer: Self-pay | Admitting: Pharmacist

## 2024-01-08 MED ORDER — EZETIMIBE 10 MG PO TABS: 10.0000 mg | ORAL_TABLET | Freq: Every day | ORAL | 3 refills | Status: DC

## 2024-01-08 NOTE — Telephone Encounter (Signed)
 Patient called reporting she cannot tolerate Bempedoic acid . She experienced pain in her lower extremities ( Uric acid lab was WNL).  so she stopped taking it after finishing her last prescription, and the pain resolved. She does not wish to continue Bempedoic acid  but is willing to retry Zetia, which had caused itching in the past.  Patient will call us  if she has any problems with Zetia; if not, she will continue taking it along with Praluent  150 mg. We will repeat labs in August 2025.

## 2024-01-30 DIAGNOSIS — E7849 Other hyperlipidemia: Secondary | ICD-10-CM

## 2024-02-08 NOTE — Telephone Encounter (Signed)
 Spoke with the patient, who reports intolerance to all LDL-lowering therapies except Praluent  150 mg every 14 days, which she tolerates well. She experienced joint pain in her lower legs with Nexletol , itching with ezetimibe , and severe myalgia with various statins. The patient discontinued Zetia  10 mg this month, after which her itching resolved. She would like to have her lipid panel rechecked in one month to assess her LDL and triglyceride levels. Her triglycerides were slightly above goal on the last lab, and lifestyle interventions were reviewed in detail.

## 2024-02-20 ENCOUNTER — Encounter: Payer: Self-pay | Admitting: Family Medicine

## 2024-02-20 ENCOUNTER — Other Ambulatory Visit: Payer: Self-pay | Admitting: Family Medicine

## 2024-02-20 DIAGNOSIS — J439 Emphysema, unspecified: Secondary | ICD-10-CM

## 2024-02-20 DIAGNOSIS — Z87891 Personal history of nicotine dependence: Secondary | ICD-10-CM

## 2024-02-29 ENCOUNTER — Ambulatory Visit: Payer: Self-pay | Admitting: Pharmacist

## 2024-02-29 LAB — LIPID PANEL
Chol/HDL Ratio: 2.9 ratio (ref 0.0–4.4)
Cholesterol, Total: 209 mg/dL — ABNORMAL HIGH (ref 100–199)
HDL: 71 mg/dL (ref >39)
LDL Chol Calc (NIH): 117 mg/dL — ABNORMAL HIGH (ref 0–99)
Triglycerides: 118 mg/dL (ref 0–149)
VLDL Cholesterol Cal: 21 mg/dL (ref 5–40)

## 2024-03-05 ENCOUNTER — Ambulatory Visit
Admission: RE | Admit: 2024-03-05 | Discharge: 2024-03-05 | Disposition: A | Discharge status: Home / Self Care | Source: Ambulatory Visit | Attending: Family Medicine | Admitting: Family Medicine

## 2024-03-05 DIAGNOSIS — Z87891 Personal history of nicotine dependence: Secondary | ICD-10-CM

## 2024-03-05 DIAGNOSIS — J439 Emphysema, unspecified: Secondary | ICD-10-CM

## 2024-05-29 ENCOUNTER — Other Ambulatory Visit: Payer: Self-pay | Admitting: Nephrology

## 2024-05-29 DIAGNOSIS — N281 Cyst of kidney, acquired: Secondary | ICD-10-CM

## 2024-06-03 ENCOUNTER — Ambulatory Visit
Admission: RE | Admit: 2024-06-03 | Discharge: 2024-06-03 | Disposition: A | Discharge status: Home / Self Care | Source: Ambulatory Visit | Attending: Nephrology | Admitting: Nephrology

## 2024-06-03 DIAGNOSIS — N281 Cyst of kidney, acquired: Secondary | ICD-10-CM

## 2024-06-15 LAB — LAB REPORT - SCANNED: EGFR: 36

## 2024-06-28 ENCOUNTER — Other Ambulatory Visit: Payer: Self-pay | Admitting: Family Medicine

## 2024-06-28 DIAGNOSIS — Z1231 Encounter for screening mammogram for malignant neoplasm of breast: Secondary | ICD-10-CM

## 2024-07-26 ENCOUNTER — Ambulatory Visit
Admission: RE | Admit: 2024-07-26 | Discharge: 2024-07-26 | Disposition: A | Discharge status: Home / Self Care | Source: Ambulatory Visit | Attending: Family Medicine | Admitting: Family Medicine

## 2024-07-26 DIAGNOSIS — Z1231 Encounter for screening mammogram for malignant neoplasm of breast: Secondary | ICD-10-CM
